# Patient Record
Sex: Female | Born: 1960 | Race: White | Hispanic: No | Marital: Married | Smoking: Never smoker
Health system: Southern US, Community
[De-identification: ages and names within clinical notes are randomized; demographics above are authoritative.]

## PROBLEM LIST (undated history)

## (undated) DIAGNOSIS — F419 Anxiety disorder, unspecified: Secondary | ICD-10-CM

## (undated) DIAGNOSIS — N289 Disorder of kidney and ureter, unspecified: Secondary | ICD-10-CM

## (undated) DIAGNOSIS — G20 Parkinson's disease: Secondary | ICD-10-CM

## (undated) DIAGNOSIS — G20A1 Parkinson's disease without dyskinesia, without mention of fluctuations: Secondary | ICD-10-CM

## (undated) DIAGNOSIS — F32A Depression, unspecified: Secondary | ICD-10-CM

## (undated) DIAGNOSIS — C4490 Unspecified malignant neoplasm of skin, unspecified: Secondary | ICD-10-CM

## (undated) HISTORY — DX: Anxiety disorder, unspecified: F41.9

## (undated) HISTORY — DX: Parkinson's disease: G20

## (undated) HISTORY — PX: MOHS SURGERY: SUR867

## (undated) HISTORY — DX: Unspecified malignant neoplasm of skin, unspecified: C44.90

## (undated) HISTORY — DX: Depression, unspecified: F32.A

## (undated) HISTORY — DX: Disorder of kidney and ureter, unspecified: N28.9

## (undated) HISTORY — DX: Parkinson's disease without dyskinesia, without mention of fluctuations: G20.A1

---

## 2007-12-21 DIAGNOSIS — Z85828 Personal history of other malignant neoplasm of skin: Secondary | ICD-10-CM | POA: Insufficient documentation

## 2013-12-06 LAB — HM COLONOSCOPY

## 2016-10-09 DIAGNOSIS — F32A Depression, unspecified: Secondary | ICD-10-CM | POA: Insufficient documentation

## 2016-12-06 LAB — HM PAP SMEAR: HM Pap smear: NORMAL

## 2017-10-09 DIAGNOSIS — F411 Generalized anxiety disorder: Secondary | ICD-10-CM | POA: Insufficient documentation

## 2019-10-05 ENCOUNTER — Ambulatory Visit (INDEPENDENT_AMBULATORY_CARE_PROVIDER_SITE_OTHER): Payer: BC Managed Care – PPO | Admitting: Medical-Surgical

## 2019-10-05 ENCOUNTER — Other Ambulatory Visit: Payer: Self-pay

## 2019-10-05 ENCOUNTER — Encounter: Payer: Self-pay | Admitting: Medical-Surgical

## 2019-10-05 VITALS — BP 130/83 | HR 83 | Temp 98.0°F | Ht 66.0 in | Wt 134.0 lb

## 2019-10-05 DIAGNOSIS — G4700 Insomnia, unspecified: Secondary | ICD-10-CM | POA: Insufficient documentation

## 2019-10-05 DIAGNOSIS — F329 Major depressive disorder, single episode, unspecified: Secondary | ICD-10-CM

## 2019-10-05 DIAGNOSIS — N951 Menopausal and female climacteric states: Secondary | ICD-10-CM | POA: Insufficient documentation

## 2019-10-05 DIAGNOSIS — F411 Generalized anxiety disorder: Secondary | ICD-10-CM | POA: Diagnosis not present

## 2019-10-05 DIAGNOSIS — Z7689 Persons encountering health services in other specified circumstances: Secondary | ICD-10-CM | POA: Diagnosis not present

## 2019-10-05 DIAGNOSIS — T7840XA Allergy, unspecified, initial encounter: Secondary | ICD-10-CM | POA: Insufficient documentation

## 2019-10-05 DIAGNOSIS — F32A Depression, unspecified: Secondary | ICD-10-CM

## 2019-10-05 NOTE — Progress Notes (Signed)
New Patient Office Visit  Subjective:  Patient ID: Elaine Curry, female    DOB: 1960/04/15  Age: 59 y.o. MRN: [id number]  CC:  Chief Complaint  Patient presents with  . Establish Care    new patient     HPI Elaine Curry presenting to establish care.  Currently under the care of of the following specialists:  Dermatology-history of skin cancer which was removed.  Reports she does for regular skin checks and has had several areas that need to be addressed/removed.  OB/GYN-she goes to [facility] for her women's health.  She is currently up-to-date on her recommended screenings.  Mammogram will be due in August.  Psychiatry-manages her depression and anxiety.  Started sertraline and Wellbutrin 1 to 2 weeks ago.  She has not seen much benefit from that she had.  Notes the last year has been very rough and she is significantly struggling with depression anxiety related to her help concerns.  Sees Dr. Theda Sers in [place].  She has done counseling in the past but none recently.  She notes that her neurologist as well as her psychiatrist has been encouraging her to start with counseling.  See PHQ/GAD-7 below.  Neurology-has been in the care from neurology for several years.  She reports she has been diagnosed with Parkinson's for different times but the diagnosis has been changed each time.  There has been quite a bit of frustration surrounding this as her symptoms are significantly affecting her quality of life.  She does have significant "sway" that affects her upper body focusing on her torso, head, and neck.  She notes this is worse depending on her dose of carbidopa-levodopa.  She has discussed this with them but unfortunately 1 tablet 4 times a day does not give her the needed duration and more than 1 tablet causes the "swaying" side effect.  She has an upcoming scan with neurology that will hopefully shed some light on what is going on.  Was most recently seeing a family medicine  practice with [facility] but would like to switch her care to [place] today.  She had recent lab work checked with notable hemoglobin of 10.6.  No history of anemia.  Anemia panel drawn at that time showed normal iron, folate, and B12.   Past Medical History:  Diagnosis Date  . Anxiety   . Depression   . Skin cancer     History reviewed. No pertinent surgical history.  History reviewed. No pertinent family history.  Social History   Socioeconomic History  . Marital status: Married    Spouse name: Not on file  . Number of children: Not on file  . Years of education: Not on file  . Highest education level: Not on file  Occupational History  . Not on file  Tobacco Use  . Smoking status: Not on file  Substance and Sexual Activity  . Alcohol use: Not on file  . Drug use: Not on file  . Sexual activity: Not on file  Other Topics Concern  . Not on file  Social History Narrative  . Not on file   Social Determinants of Health   Financial Resource Strain:   . Difficulty of Paying Living Expenses:   Food Insecurity:   . Worried About Charity fundraiser in the Last Year:   . Arboriculturist in the Last Year:   Transportation Needs:   . Film/video editor (Medical):   Marland Kitchen Lack of Transportation (Non-Medical):   Physical Activity:   .  Days of Exercise per Week:   . Minutes of Exercise per Session:   Stress:   . Feeling of Stress :   Social Connections:   . Frequency of Communication with Friends and Family:   . Frequency of Social Gatherings with Friends and Family:   . Attends Religious Services:   . Active Member of Clubs or Organizations:   . Attends Archivist Meetings:   Marland Kitchen Marital Status:   Intimate Partner Violence:   . Fear of Current or Ex-Partner:   . Emotionally Abused:   Marland Kitchen Physically Abused:   . Sexually Abused:     ROS Review of Systems  Constitutional: Positive for fatigue. Negative for chills, fever and unexpected weight change.  HENT:  Positive for voice change.   Eyes: Positive for visual disturbance.  Respiratory: Negative for cough, chest tightness, shortness of breath and wheezing.   Cardiovascular: Negative for chest pain, palpitations and leg swelling.  Gastrointestinal: Positive for constipation.  Musculoskeletal: Positive for back pain, myalgias and neck pain.  Neurological: Positive for dizziness, tremors, speech difficulty, weakness, light-headedness, numbness and headaches. Negative for seizures, syncope and facial asymmetry.  Hematological: Bruises/bleeds easily.  Psychiatric/Behavioral: Positive for dysphoric mood, sleep disturbance and suicidal ideas. Negative for self-injury. The patient is nervous/anxious.    Depression screen PHQ 2/9 10/05/2019  Decreased Interest 3  Down, Depressed, Hopeless 3  PHQ - 2 Score 6  Altered sleeping 3  Tired, decreased energy 3  Change in appetite 2  Feeling bad or failure about yourself  2  Suicidal thoughts 3  PHQ-9 Score 19  Difficult doing work/chores Somewhat difficult   GAD 7 : Generalized Anxiety Score 10/05/2019  Nervous, Anxious, on Edge 2  Control/stop worrying 2  Worry too much - different things 2  Trouble relaxing 2  Restless 2  Easily annoyed or irritable 2  Afraid - awful might happen 2  Total GAD 7 Score 14   Objective:   Today's Vitals: BP 130/83 (BP Location: Left Arm, Patient Position: Sitting)   Pulse 83   Temp 98 F (36.7 C)   Ht 5\' 6"  (1.676 m)   Wt 134 lb (60.8 kg)   SpO2 95%   BMI 21.63 kg/m   Physical Exam Vitals reviewed.  Constitutional:      General: She is not in acute distress.    Appearance: Normal appearance.  HENT:     Head: Normocephalic and atraumatic.  Cardiovascular:     Rate and Rhythm: Normal rate and regular rhythm.     Pulses: Normal pulses.     Heart sounds: Normal heart sounds. No murmur heard.  No friction rub. No gallop.   Pulmonary:     Effort: Pulmonary effort is normal. No respiratory distress.      Breath sounds: Normal breath sounds. No wheezing.  Skin:    General: Skin is warm and dry.  Neurological:     Mental Status: She is alert and oriented to person, place, and time.     Coordination: Coordination abnormal.     Comments: Involuntary choreiform movements affecting the trunk, head, and neck; continuous in nature for the duration of the appointment. Balance disturbed with ambulation.  Psychiatric:        Mood and Affect: Mood normal.        Behavior: Behavior normal.        Thought Content: Thought content normal.        Judgment: Judgment normal.     Assessment & Plan:  1. Encounter to establish care Reviewed available records and discussed health concerns with patient.  She is being seen by multiple specialist so we will manage her general primary care needs.  With her recent new onset anemia, discussed rechecking her CBC today.  With the significant level of "sway", she would like to postpone as it is very difficult to have blood drawn with such excessive movement.  Advised patient should she start to feel safe and worsening fatigue, lightheadedness, dizziness, etc. that she should return so that we can check her CBC again.  2. Depression, unspecified depression type/GAD Discussed expectations of improvement when on antidepressants.  Recommend close follow-up with psychiatry.  Discussed counseling and the benefits that he may bring.  She understands that finding the right connection is very important for successful counseling.  Advised patient that should she decide counseling is something she would like to try she can either touch base with psychiatry or me and we will get her in touch with someone for therapy.  Outpatient Encounter Medications as of 10/05/2019  Medication Sig  . tiZANidine (ZANAFLEX) 4 MG tablet Take by mouth.  Marland Kitchen buPROPion (WELLBUTRIN XL) 150 MG 24 hr tablet Take 150 mg by mouth daily.  . carbidopa-levodopa (SINEMET IR) 25-100 MG tablet Take 1.5 tablets by  mouth 4 (four) times daily.  Marland Kitchen estradiol-norethindrone (MIMVEY) 1-0.5 MG tablet Mimvey 1 mg-0.5 mg tablet  TAKE 1 TABLET EVERY DAY  . Eszopiclone 3 MG TABS Take 3 mg by mouth at bedtime.  . gabapentin (NEURONTIN) 300 MG capsule Take 600 mg by mouth 2 (two) times daily.  Marland Kitchen LORazepam (ATIVAN) 0.5 MG tablet Take 0.5 mg by mouth 2 (two) times daily as needed.  . sertraline (ZOLOFT) 50 MG tablet Take by mouth.   No facility-administered encounter medications on file as of 10/05/2019.    Follow-up: Return in about 3 months (around 01/05/2020) for follow up on anemia.   Clearnce Sorrel, DNP, APRN, FNP-BC [facility]

## 2019-11-02 ENCOUNTER — Ambulatory Visit: Payer: BC Managed Care – PPO | Admitting: Sports Medicine

## 2019-12-09 ENCOUNTER — Other Ambulatory Visit: Payer: Self-pay

## 2019-12-09 ENCOUNTER — Encounter: Payer: Self-pay | Admitting: Medical-Surgical

## 2019-12-09 ENCOUNTER — Ambulatory Visit (INDEPENDENT_AMBULATORY_CARE_PROVIDER_SITE_OTHER): Payer: BC Managed Care – PPO | Admitting: Medical-Surgical

## 2019-12-09 VITALS — BP 106/58 | HR 118 | Temp 98.4°F | Ht 66.0 in | Wt 129.0 lb

## 2019-12-09 DIAGNOSIS — R55 Syncope and collapse: Secondary | ICD-10-CM | POA: Diagnosis not present

## 2019-12-09 DIAGNOSIS — N289 Disorder of kidney and ureter, unspecified: Secondary | ICD-10-CM

## 2019-12-09 NOTE — Progress Notes (Signed)
Subjective:    CC: chronic syncope  HPI: Pleasant 59 year old female accompanied by her husband presenting today for "chronic syncope".  Has been having episodes over the last 3 weeks intermittently where she "passes out" for 15 to 20 seconds before returning to consciousness.  Her husband witnessed the first episode 3 weeks ago when she got out of the car in a hurry to get in the store.  She made it to the sidewalk but then "convulsed" followed by passing out with her eyes closed and falling towards the ground.  He was closed behind her and was able to catch her so there was no injury.  He yelled for an ambulance to be called.  At that time she awoke and yelled no ambulance.  She has had several of these episodes recently where she feels very hot and her vision starts to go dark.  She closes her eyes and she notes that her extremities begin to tremor.  Her balance has been off and she is now using a walker or a cane to ambulate.  She also notes her speech has changed and has been difficult to understand by others.  She has had at least 4 episodes like this in the past month.  Notes that changing position very quickly worsens the symptoms while laying down or sitting down tends to help.  She does have a bit of an elevation in heart rate with the symptoms but no other accompanying issues.  She fell yesterday and hit her head on nightstand but aside from bruising has had no other injury.  Heart disease does run in her family in both her dad, mother, and her 2 brothers.  She has had no personal history of heart disease.  Is being followed by neurology for Parkinson's disease and a plan is in the works to have a brain stimulator implanted.  She does take several medications and feels that her symptoms now are related to side effects.  I reviewed the past medical history, family history, social history, surgical history, and allergies today and no changes were needed.  Please see the problem list section below  in epic for further details.  Past Medical History: Past Medical History:  Diagnosis Date  . Anxiety   . Depression   . Skin cancer    Past Surgical History: History reviewed. No pertinent surgical history. Social History: Social History   Socioeconomic History  . Marital status: Married    Spouse name: Not on file  . Number of children: Not on file  . Years of education: Not on file  . Highest education level: Not on file  Occupational History  . Not on file  Tobacco Use  . Smoking status: Never Smoker  . Smokeless tobacco: Never Used  Substance and Sexual Activity  . Alcohol use: Yes    Comment: Occasionally  . Drug use: Never  . Sexual activity: Not Currently  Other Topics Concern  . Not on file  Social History Narrative  . Not on file   Social Determinants of Health   Financial Resource Strain:   . Difficulty of Paying Living Expenses: Not on file  Food Insecurity:   . Worried About Charity fundraiser in the Last Year: Not on file  . Ran Out of Food in the Last Year: Not on file  Transportation Needs:   . Lack of Transportation (Medical): Not on file  . Lack of Transportation (Non-Medical): Not on file  Physical Activity:   . Days  of Exercise per Week: Not on file  . Minutes of Exercise per Session: Not on file  Stress:   . Feeling of Stress : Not on file  Social Connections:   . Frequency of Communication with Friends and Family: Not on file  . Frequency of Social Gatherings with Friends and Family: Not on file  . Attends Religious Services: Not on file  . Active Member of Clubs or Organizations: Not on file  . Attends Archivist Meetings: Not on file  . Marital Status: Not on file   Family History: History reviewed. No pertinent family history. Allergies: No Known Allergies Medications: See med rec.  Review of Systems: See HPI for pertinent positives and negatives.   Objective:    General: Well Developed, well nourished, and in no  acute distress.  Neuro: Alert and oriented x3.  Chorea affecting the head, neck, and torso but sparing the upper arms and hands.  Speech overall clear. HEENT: Normocephalic, atraumatic.  Skin: Warm and dry. Cardiac: Regular rate and rhythm, no murmurs rubs or gallops, no lower extremity edema.  Respiratory: Clear to auscultation bilaterally. Not using accessory muscles, speaking in full sentences.  EKG-rate 77, normal rhythm, normal axis.  Chorea movements interfering with reading and producing significant artifact. Impression and Recommendations:    1. Syncope, unspecified syncope type Unclear etiology.  Evaluated by neurology recently who directed her to her PCP.  Her last hemoglobin was a bit low so we will recheck a CBC.  Also checking CMP for electrolytes, kidney, and liver function.  In office EKG completed with no acute changes but reading negatively affected by artifact from excessive constant movement.  We will go ahead and order an echocardiogram to be completed to rule out valvular malfunction.  Also ordering a Zio patch for long-term monitoring to rule out intermittent dysrhythmia.  Return in about 2 weeks (around 12/23/2019) for syncope follow up. ___________________________________________ Clearnce Sorrel, DNP, APRN, FNP-BC [facility]

## 2019-12-10 ENCOUNTER — Telehealth: Payer: Self-pay | Admitting: Radiology

## 2019-12-10 LAB — COMPLETE METABOLIC PANEL WITH GFR
AG Ratio: 1.8 (calc) (ref 1.0–2.5)
ALT: 3 U/L — ABNORMAL LOW (ref 6–29)
AST: 9 U/L — ABNORMAL LOW (ref 10–35)
Albumin: 4.4 g/dL (ref 3.6–5.1)
Alkaline phosphatase (APISO): 37 U/L (ref 37–153)
BUN/Creatinine Ratio: 23 (calc) — ABNORMAL HIGH (ref 6–22)
BUN: 42 mg/dL — ABNORMAL HIGH (ref 7–25)
CO2: 23 mmol/L (ref 20–32)
Calcium: 9.7 mg/dL (ref 8.6–10.4)
Chloride: 105 mmol/L (ref 98–110)
Creat: 1.85 mg/dL — ABNORMAL HIGH (ref 0.50–1.05)
GFR, Est African American: 34 mL/min/{1.73_m2} — ABNORMAL LOW (ref >=60)
GFR, Est Non African American: 29 mL/min/{1.73_m2} — ABNORMAL LOW (ref >=60)
Globulin: 2.5 g/dL (calc) (ref 1.9–3.7)
Glucose, Bld: 88 mg/dL (ref 65–99)
Potassium: 4.2 mmol/L (ref 3.5–5.3)
Sodium: 138 mmol/L (ref 135–146)
Total Bilirubin: 0.4 mg/dL (ref 0.2–1.2)
Total Protein: 6.9 g/dL (ref 6.1–8.1)

## 2019-12-10 LAB — CBC
HCT: 35.4 % (ref 35.0–45.0)
Hemoglobin: 11.6 g/dL — ABNORMAL LOW (ref 11.7–15.5)
MCH: 32.6 pg (ref 27.0–33.0)
MCHC: 32.8 g/dL (ref 32.0–36.0)
MCV: 99.4 fL (ref 80.0–100.0)
MPV: 14.3 fL — ABNORMAL HIGH (ref 7.5–12.5)
Platelets: 203 10*3/uL (ref 140–400)
RBC: 3.56 10*6/uL — ABNORMAL LOW (ref 3.80–5.10)
RDW: 13 % (ref 11.0–15.0)
WBC: 9.7 10*3/uL (ref 3.8–10.8)

## 2019-12-10 NOTE — Addendum Note (Signed)
Addended bySamuel Bouche on: 12/10/2019 11:09 AM   Modules accepted: Orders

## 2019-12-10 NOTE — Telephone Encounter (Signed)
Enrolled patient for a 14 day Zio XT monitor to be mailed to patients home. Brief instructions were gone over with the patient and she knows to expect the monitor to arrive in 3-4 days.  

## 2019-12-13 ENCOUNTER — Telehealth: Payer: Self-pay

## 2019-12-13 NOTE — Telephone Encounter (Signed)
If she would like, she can go ahead and come in today for the lab draw to check her kidney function. I can put the order in for a STAT CMP. If she is significantly concerned and the swelling is more than mild puffiness, we will need her to come in for a physical exam.

## 2019-12-13 NOTE — Telephone Encounter (Signed)
Pt called and said that she woke up this morning with swelling in her feet, arms, legs, and hands. She is nervous because of the lab results she received last week that resulted as:  Anemia has improved but still mildly low. Kidney function significantly decreased since last check in June. Possible dehydration? Recommend increasing fluid intake and hydrating very well over the next week. We will need to recheck the kidney function in one week.   Pt states that she has been drinking a lot of water for hydration and will be coming back in to have repeat labs done but is scared that something really bad is wrong right now. Please advise.

## 2019-12-14 ENCOUNTER — Telehealth: Payer: Self-pay

## 2019-12-14 NOTE — Telephone Encounter (Signed)
Pt called and LVM saying that she was seen at the ER this morning regarding her bilateral arm and leg pain and swelling. Pt said she thought she was having a heart attack. A telephone message was created on 12/13/2019 but got lost in message pools. I called her and she said that she was seen this morning at [facility] ER. She said that they did a lot of blood work and that everything came back normal. She said she was told it was due to dehydration and a panic attack. She is still having some dizziness, feels off balance, and the pain in her hands is returning. Looking in her chart under CareEverywhere there are not records of her being seen at the [facility] ER. She said that she was given a discharge summary, that her husband put it somewhere, that she is resting and cannot get up to get it, and her husband is asleep at the moment.

## 2019-12-15 ENCOUNTER — Ambulatory Visit (INDEPENDENT_AMBULATORY_CARE_PROVIDER_SITE_OTHER): Payer: BC Managed Care – PPO

## 2019-12-15 DIAGNOSIS — R55 Syncope and collapse: Secondary | ICD-10-CM | POA: Diagnosis not present

## 2019-12-15 NOTE — Telephone Encounter (Signed)
Care Everywhere has finally updated and her visit to the ER is showing now. Their impression of her symptoms was dehydration and panic attack. Her lab work looked better regarding her kidney function. Continue with plan to hydrate well and follow up as planned. Considering possible medication side effect but possibly related to cardiac concerns given troponins were elevated in the ED. Will plan to do further investigation to rule out cardiovascular concerns. We can discuss this further at her follow up appointment. If her symptoms continue and she would like to be seen sooner, she is welcome to come in before the 2 week follow up.

## 2019-12-15 NOTE — Telephone Encounter (Signed)
Patient aware of response and recommendations. She would like to wait and come in to have her bloodwork done on Friday and see what the results say and go from there. No further questions or concerns at this time.

## 2019-12-18 LAB — BASIC METABOLIC PANEL WITH GFR
BUN/Creatinine Ratio: 13 (calc) (ref 6–22)
BUN: 22 mg/dL (ref 7–25)
CO2: 24 mmol/L (ref 20–32)
Calcium: 9.9 mg/dL (ref 8.6–10.4)
Chloride: 103 mmol/L (ref 98–110)
Creat: 1.67 mg/dL — ABNORMAL HIGH (ref 0.50–1.05)
GFR, Est African American: 39 mL/min/{1.73_m2} — ABNORMAL LOW (ref >=60)
GFR, Est Non African American: 33 mL/min/{1.73_m2} — ABNORMAL LOW (ref >=60)
Glucose, Bld: 83 mg/dL (ref 65–139)
Potassium: 3.7 mmol/L (ref 3.5–5.3)
Sodium: 140 mmol/L (ref 135–146)

## 2019-12-19 NOTE — Addendum Note (Signed)
Addended bySamuel Bouche on: 12/19/2019 04:46 PM   Modules accepted: Orders

## 2020-01-03 ENCOUNTER — Ambulatory Visit (HOSPITAL_BASED_OUTPATIENT_CLINIC_OR_DEPARTMENT_OTHER)
Admission: RE | Admit: 2020-01-03 | Discharge: 2020-01-03 | Disposition: A | Discharge status: Home / Self Care | Payer: BC Managed Care – PPO | Source: Ambulatory Visit | Attending: Medical-Surgical | Admitting: Medical-Surgical

## 2020-01-03 ENCOUNTER — Other Ambulatory Visit: Payer: Self-pay

## 2020-01-03 ENCOUNTER — Other Ambulatory Visit (HOSPITAL_BASED_OUTPATIENT_CLINIC_OR_DEPARTMENT_OTHER): Payer: BC Managed Care – PPO

## 2020-01-03 DIAGNOSIS — R55 Syncope and collapse: Secondary | ICD-10-CM | POA: Diagnosis present

## 2020-01-03 LAB — ECHOCARDIOGRAM COMPLETE
Area-P 1/2: 3.74 cm2
S' Lateral: 1.85 cm

## 2020-01-04 ENCOUNTER — Ambulatory Visit: Payer: BC Managed Care – PPO | Admitting: Medical-Surgical

## 2020-01-10 ENCOUNTER — Other Ambulatory Visit: Payer: Self-pay | Admitting: Medical-Surgical

## 2020-01-10 DIAGNOSIS — I471 Supraventricular tachycardia: Secondary | ICD-10-CM

## 2020-01-10 DIAGNOSIS — R55 Syncope and collapse: Secondary | ICD-10-CM

## 2020-01-12 ENCOUNTER — Telehealth: Payer: Self-pay

## 2020-01-12 NOTE — Telephone Encounter (Signed)
Pt called and stated that she is wanting to get a handicap placard and wanted to know if Caryl Asp would be willing to complete the form. We have forms here and Caryl Asp was agreeable to completing the form. Pt aware that the form will be completed and she can come by later on this afternoon and pick it up from the front desk.

## 2020-01-14 ENCOUNTER — Telehealth: Payer: Self-pay

## 2020-01-14 LAB — HM MAMMOGRAPHY

## 2020-01-14 NOTE — Telephone Encounter (Signed)
Terri, the case manger for patient, called and LVM wanting to report that Elaine Curry has been having some swelling in her hands and feet. Asmi told her that this started not long after starting midodrine 5 mg TID.   I called Camillia and she confirmed the above. The Rx was started and written by her Neurologist, Dr. Buck Mam. I asked her if she called their office to tell them about this and she said that she had an appointment there on 01/07/2020 and they told her to wear compression stockings. I told her that since Dr. Buck Mam prescribed it and is managing it, that she needed to contact their office for further advisement. I also told her that I would send a message to [place] and let her know what is going on. She said that she had an appt scheduled with Joy on 01/25/2020 and she would show her then. I told her that she needed to contact Dr. Normajean Baxter office today and I would let Joy know about this because she did not need to wait and things get worse. Pt verbalized understanding and said she would call Dr. Buck Mam.

## 2020-01-19 LAB — HM MAMMOGRAPHY

## 2020-01-25 ENCOUNTER — Encounter: Payer: Self-pay | Admitting: Medical-Surgical

## 2020-01-25 ENCOUNTER — Ambulatory Visit (INDEPENDENT_AMBULATORY_CARE_PROVIDER_SITE_OTHER): Payer: BC Managed Care – PPO | Admitting: Medical-Surgical

## 2020-01-25 ENCOUNTER — Other Ambulatory Visit: Payer: Self-pay

## 2020-01-25 VITALS — BP 158/85 | HR 84 | Temp 97.6°F | Ht 66.0 in | Wt 132.0 lb

## 2020-01-25 DIAGNOSIS — R55 Syncope and collapse: Secondary | ICD-10-CM

## 2020-01-25 DIAGNOSIS — M7989 Other specified soft tissue disorders: Secondary | ICD-10-CM

## 2020-01-25 DIAGNOSIS — M5412 Radiculopathy, cervical region: Secondary | ICD-10-CM | POA: Insufficient documentation

## 2020-01-25 DIAGNOSIS — N1832 Chronic kidney disease, stage 3b: Secondary | ICD-10-CM | POA: Diagnosis not present

## 2020-01-25 DIAGNOSIS — D649 Anemia, unspecified: Secondary | ICD-10-CM

## 2020-01-25 DIAGNOSIS — F411 Generalized anxiety disorder: Secondary | ICD-10-CM

## 2020-01-25 DIAGNOSIS — I471 Supraventricular tachycardia: Secondary | ICD-10-CM | POA: Diagnosis not present

## 2020-01-25 DIAGNOSIS — F32A Depression, unspecified: Secondary | ICD-10-CM

## 2020-01-25 DIAGNOSIS — G4700 Insomnia, unspecified: Secondary | ICD-10-CM

## 2020-01-25 MED ORDER — PREDNISONE 50 MG PO TABS: 50.0000 mg | ORAL_TABLET | Freq: Every day | ORAL | 0 refills | Status: DC

## 2020-01-25 MED ORDER — METHOCARBAMOL 500 MG PO TABS: 500.0000 mg | ORAL_TABLET | Freq: Three times a day (TID) | ORAL | 0 refills | Status: DC | PRN

## 2020-01-25 NOTE — Progress Notes (Signed)
Subjective:    CC: anemia, chronic issue follow up  HPI: Elaine Curry 59 year old female presenting for follow up of anemia and chronic diseases.  Anemia- resolved. Last blood work showing hemoglobin of 11.8.  Syncopal episodes- resolved. Last episode of syncope 1 month ago. Has worked to stay well hydrated. Is drinking much more water flavored with Crystal Light.  Anxiety/depression- managed by psychiatry at this time but would like to have her medication managed here. She takes Sertraline 100mg  and wellbutrin XL 150mg  daily. Takes Lorazepam 0.5mg  1-2 times daily as needed for anxiety. Feels these medications are helpful but notes frustration surrounding her medical conditions and the frequent follow ups required. Denies SI/HI.  Insomnia- taking Eszopiclone 3mg  nightly for sleep. Tolerating well and feels this is helpful. Currently managed by psychiatry but would like this managed by [place] as well.   Left arm pain- history of bulging disc and 2 pinched cervical nerves. Now reports a sharp stabbing pain down her left arm starting at the shoulder that has been present for a while. She gets injections periodically in her neck and has the next ones coming up on Thursday. These are helpful if the doctor is able to get them in the right spot but sometimes they aren't.   Bilateral hand swelling/discoloration- notes bilateral hand swelling, R > L, happens at night when lying down. Also notes, hands become discolored white from the wrist up but she has also noted them bluish and then reddened. No prior diagnosis of Raynaud's.   I reviewed the past medical history, family history, social history, surgical history, and allergies today and no changes were needed.  Please see the problem list section below in epic for further details.  Past Medical History: Past Medical History:  Diagnosis Date  . Anxiety   . Depression   . Skin cancer    Past Surgical History: History reviewed. No pertinent surgical  history. Social History: Social History   Socioeconomic History  . Marital status: Married    Spouse name: Not on file  . Number of children: Not on file  . Years of education: Not on file  . Highest education level: Not on file  Occupational History  . Not on file  Tobacco Use  . Smoking status: Never Smoker  . Smokeless tobacco: Never Used  Substance and Sexual Activity  . Alcohol use: Yes    Comment: Occasionally  . Drug use: Never  . Sexual activity: Not Currently  Other Topics Concern  . Not on file  Social History Narrative  . Not on file   Social Determinants of Health   Financial Resource Strain:   . Difficulty of Paying Living Expenses: Not on file  Food Insecurity:   . Worried About Charity fundraiser in the Last Year: Not on file  . Ran Out of Food in the Last Year: Not on file  Transportation Needs:   . Lack of Transportation (Medical): Not on file  . Lack of Transportation (Non-Medical): Not on file  Physical Activity:   . Days of Exercise per Week: Not on file  . Minutes of Exercise per Session: Not on file  Stress:   . Feeling of Stress : Not on file  Social Connections:   . Frequency of Communication with Friends and Family: Not on file  . Frequency of Social Gatherings with Friends and Family: Not on file  . Attends Religious Services: Not on file  . Active Member of Clubs or Organizations: Not on file  .  Attends Archivist Meetings: Not on file  . Marital Status: Not on file   Family History: History reviewed. No pertinent family history. Allergies: No Known Allergies Medications: See med rec.  Review of Systems: See HPI for pertinent positives and negatives.   Objective:    General: Well Developed, well nourished, and in no acute distress.  Neuro: Alert and oriented x3. Chorea-like movements continue for the head, trunk, and upper extremities, somewhat improved from last visit. HEENT: Normocephalic, atraumatic.  Skin: Warm and  dry. Scattered bruising to bilateral forearms in various stages of healing. Cardiac: Regular rate and rhythm, no murmurs rubs or gallops, no lower extremity edema.  Respiratory: Clear to auscultation bilaterally. Not using accessory muscles, speaking in full sentences.  Impression and Recommendations:    1. Syncope, unspecified syncope type Stable. No episodes in the last month. Continue to hydrate well. Discussed driving laws in  regarding syncopal episodes and the need to go 6 months without syncope before legally returning to driving.   2. Stage 3b chronic kidney disease ([place]) Followed by nephrology. Recommend contacting them regarding the urine tests that have not been collected. They may have suggestions to facilitate getting a sample.   3. Paroxysmal SVT (supraventricular tachycardia) [facility]) Upcoming cardiology visit for further evaluation.   4. Cervical radiculopathy Plan to go forward with injections. Prednisone 50mg  x 5 days sent to pharmacy. Discuss oral prednisone with injecting MD to verify this is okay. If cleared, take as directed to reduce inflammation and help left arm pain. Since Tizanidine causes dizziness, sending a small supply of methocarbamol in to see if this works without side effects. If so, can change over. If not, can return to using Tizanidine.   5. Bilateral hand swelling Recommend bilateral compression gloves at night. Suspect discoloration of hands is Raynaud's from the description. Wearing gloves may help to prevent Raynaud's flares if that is what is happening.   6. Insomnia, unspecified type Continue Lunesta 3mg  nightly. Will be glad to manage this if she desires.   7. Generalized anxiety disorder/Depression, unspecified depression type Continue Wellbutrin 150mg  and Sertraline 100mg  daily. Continue Lorazepam 0.5mg  BID prn, encouraged sparing use due to risk of physical and psychological dependency. Will be glad to manage these prescriptions if she desires.    8. Anemia, unspecified type Resolved. With current kidney function concerns, will need to monitor closely.   Return in about 3 months (around 04/26/2020) for chronic disease follow up. ___________________________________________ Clearnce Sorrel, DNP, APRN, FNP-BC Primary [facility] and [place]

## 2020-01-25 NOTE — Telephone Encounter (Signed)
Pt had an OV with Samuel Bouche, FNP today.

## 2020-02-21 NOTE — Progress Notes (Signed)
Referring-Joy Charna Archer, NP Reason for referral-syncope and SVT  HPI: 59 year old female for evaluation of syncope and SVT at request of Samuel Bouche, NP.  Echocardiogram October 2021 showed ejection fraction 60 to 65%, mild mitral regurgitation.  Monitor October 2021 showed sinus rhythm with short bursts of SVT with the longest lasting 31.7 seconds, rare PACs and PVCs. Patient states that over the past 4 months she has had difficulties with dizziness with standing. She had lost approximately 20 pounds. She then had frank syncopal episodes. Each episode occurred when rising from the sitting to the standing position. She would suddenly become dizzy and pass out. She was unconscious for approximately 20 seconds. Patient denies preceding nausea, diaphoresis, palpitations, chest pain or dyspnea. Note she has had occasional complications in the past but had none with a monitor in place. Cardiology now asked to evaluate.  Current Outpatient Medications  Medication Sig Dispense Refill  . amantadine (SYMMETREL) 100 MG capsule Take 100 mg by mouth 3 (three) times daily.    . BELSOMRA 10 MG TABS Take 1 tablet by mouth at bedtime. 30 minutes before getting into bed    . buPROPion (WELLBUTRIN XL) 150 MG 24 hr tablet Take 150 mg by mouth daily.    . carbidopa-levodopa (SINEMET IR) 25-100 MG tablet Take 1 tablet by mouth 5 (five) times daily.     Marland Kitchen estradiol-norethindrone (MIMVEY) 1-0.5 MG tablet [place] 1 mg-0.5 mg tablet  TAKE 1 TABLET EVERY DAY    . Eszopiclone 3 MG TABS Take 3 mg by mouth at bedtime.    . gabapentin (NEURONTIN) 300 MG capsule Take 600 mg by mouth 2 (two) times daily.    Marland Kitchen LORazepam (ATIVAN) 1 MG tablet Take 1 mg by mouth 2 (two) times daily.    . melatonin 5 MG TABS Take 1 tablet by mouth at bedtime as needed.    . methocarbamol (ROBAXIN) 500 MG tablet Take 1 tablet (500 mg total) by mouth 3 (three) times daily as needed for muscle spasms. DO NOT TAKE WITH TIZANIDINE 30 tablet 0  .  sertraline (ZOLOFT) 50 MG tablet Take 100 mg by mouth daily.    Marland Kitchen tiZANidine (ZANAFLEX) 4 MG tablet Take 4 mg by mouth 4 (four) times daily as needed.     . predniSONE (DELTASONE) 50 MG tablet Take 1 tablet (50 mg total) by mouth daily. HOLD OFF UNTIL AFTER INJECTIONS, START IF OK WITH DR. Finis Bud (Patient not taking: Reported on 02/23/2020) 5 tablet 0   No current facility-administered medications for this visit.    No Known Allergies   Past Medical History:  Diagnosis Date  . Anxiety   . Depression   . Parkinson's disease ([place])   . Renal insufficiency   . Skin cancer     Past Surgical History:  Procedure Laterality Date  . MOHS SURGERY      Social History   Socioeconomic History  . Marital status: Married    Spouse name: Not on file  . Number of children: 2  . Years of education: Not on file  . Highest education level: Not on file  Occupational History  . Not on file  Tobacco Use  . Smoking status: Never Smoker  . Smokeless tobacco: Never Used  Substance and Sexual Activity  . Alcohol use: Yes    Comment: Occasionally  . Drug use: Never  . Sexual activity: Not Currently  Other Topics Concern  . Not on file  Social History Narrative  . Not on  file   Social Determinants of Health   Financial Resource Strain:   . Difficulty of Paying Living Expenses: Not on file  Food Insecurity:   . Worried About Charity fundraiser in the Last Year: Not on file  . Ran Out of Food in the Last Year: Not on file  Transportation Needs:   . Lack of Transportation (Medical): Not on file  . Lack of Transportation (Non-Medical): Not on file  Physical Activity:   . Days of Exercise per Week: Not on file  . Minutes of Exercise per Session: Not on file  Stress:   . Feeling of Stress : Not on file  Social Connections:   . Frequency of Communication with Friends and Family: Not on file  . Frequency of Social Gatherings with Friends and Family: Not on file  . Attends Religious  Services: Not on file  . Active Member of Clubs or Organizations: Not on file  . Attends Archivist Meetings: Not on file  . Marital Status: Not on file  Intimate Partner Violence:   . Fear of Current or Ex-Partner: Not on file  . Emotionally Abused: Not on file  . Physically Abused: Not on file  . Sexually Abused: Not on file    Family History  Problem Relation Age of Onset  . Lung cancer Mother   . Esophageal cancer Father     ROS: Left shoulder and neck pain but no fevers or chills, productive cough, hemoptysis, dysphasia, odynophagia, melena, hematochezia, dysuria, hematuria, rash, seizure activity, orthopnea, PND, claudication. Remaining systems are negative.  Physical Exam:   Blood pressure (!) 145/86, pulse 67, height 5\' 6"  (1.676 m), weight 132 lb (59.9 kg).  General:  Well developed/well nourished in NAD Skin warm/dry Patient not depressed No peripheral clubbing Back-normal HEENT-normal/normal eyelids Neck supple/normal carotid upstroke bilaterally; no bruits; no JVD; no thyromegaly chest - CTA/ normal expansion CV - RRR/normal S1 and S2; no murmurs, rubs or gallops;  PMI nondisplaced Abdomen -NT/ND, no HSM, no mass, + bowel sounds, no bruit 2+ femoral pulses, no bruits Ext-1+ ankle edema, no chords Neuro-resting tremor noted and exam consistent with Parkinson's.  ECG -December 09, 2019-sinus rhythm with probable precordial lead reversal.  Personally reviewed  Electrocardiogram today shows sinus rhythm with no ST changes.  Personally reviewed.  A/P  1 syncope-symptoms clearly secondary to orthostatic hypotension based on history. This may be related to her Parkinson's. However she had also lost 20 pounds prior to her recent events. We discussed the importance of hydration and increasing sodium intake. She has been doing this and her symptoms are much improved. We will consider addition of compression hose or medications in the future if needed. Note her  LV function is normal.  2 supraventricular tachycardia-patient noted to have 30 seconds of SVT on recent monitor. However she was asymptomatic and does not have significant problems with palpitations. I would like to avoid a beta-blocker at this point as it may exacerbate her orthostatic hypotension. We can consider low-dose in the future if needed. She will contact us if she has palpitations that are worsening.  Kirk Ruths, MD

## 2020-02-23 ENCOUNTER — Ambulatory Visit: Payer: BC Managed Care – PPO | Admitting: Cardiology

## 2020-02-23 ENCOUNTER — Other Ambulatory Visit: Payer: Self-pay

## 2020-02-23 ENCOUNTER — Encounter: Payer: Self-pay | Admitting: Cardiology

## 2020-02-23 VITALS — BP 145/86 | HR 67 | Ht 66.0 in | Wt 132.0 lb

## 2020-02-23 DIAGNOSIS — R55 Syncope and collapse: Secondary | ICD-10-CM | POA: Diagnosis not present

## 2020-02-23 DIAGNOSIS — I471 Supraventricular tachycardia: Secondary | ICD-10-CM

## 2020-02-23 NOTE — Patient Instructions (Signed)

## 2020-03-16 ENCOUNTER — Encounter: Payer: Self-pay | Admitting: Medical-Surgical

## 2020-03-16 ENCOUNTER — Ambulatory Visit (INDEPENDENT_AMBULATORY_CARE_PROVIDER_SITE_OTHER): Payer: BC Managed Care – PPO | Admitting: Medical-Surgical

## 2020-03-16 VITALS — BP 135/80 | HR 72 | Temp 97.5°F | Ht 66.0 in | Wt 129.0 lb

## 2020-03-16 DIAGNOSIS — R441 Visual hallucinations: Secondary | ICD-10-CM

## 2020-03-16 DIAGNOSIS — M545 Low back pain, unspecified: Secondary | ICD-10-CM | POA: Diagnosis not present

## 2020-03-16 NOTE — Progress Notes (Signed)
Subjective:    CC: low back pain, hallucination  HPI: Pleasant 59 year old female presenting today with reports of low back pain and hallucinations.  Back pain- started approximately 1 week ago. Notes the pain is in the left and radiates around the hip into the inguinal crease. Pain is sharp, stabbing at times but otherwise a constant ache. Worse when bending over, best when lying flat. Stretching while lying on the floor helps some too. Tizanidine helps some but not enough and it makes her drowsy. The methocarbamol did not work for her at all so is not taking that. Has the prednisone burst prescription at home that was previously prescribed for her neck but she never took since it got better on it's own. No history of back surgeries or injuries. No recent falls or trauma.   Hallucinations- has been having hallucinations for about a week or so. Notes only visual hallucinations, none have been auditory. Examples include seeing bed bugs, a lizard stuck in the sheet, and people at the door. Also saw a snake near her hand last night. The most concerning was seeing someone trying to steal her car or break in on the [employer] doorbell camera. She went and got the gun out of the gun safe and had it sitting on the table, called the police and her husband. No evidence of any of these things has been found. Patient reports these things look real to her. Her husband has tried to argue with her to make her understand that what she is seeing is not real. No vision loss, alcohol/illegal drug use. No signs/symptoms of infection. She is on Belsomra at bedtime, Ativan BID, Sertraline daily, and Wellbutrin daily. Discussed stopping or reducing the dose of these medications but she is concerned about being able to sleep and possible effects on mood. Husband and patient would like me to communicate with neurology to see what their take on it is.   I reviewed the past medical history, family history, social history, surgical  history, and allergies today and no changes were needed.  Please see the problem list section below in epic for further details.  Past Medical History: Past Medical History:  Diagnosis Date  . Anxiety   . Depression   . Parkinson's disease ([place])   . Renal insufficiency   . Skin cancer    Past Surgical History: Past Surgical History:  Procedure Laterality Date  . MOHS SURGERY     Social History: Social History   Socioeconomic History  . Marital status: Married    Spouse name: Not on file  . Number of children: 2  . Years of education: Not on file  . Highest education level: Not on file  Occupational History  . Not on file  Tobacco Use  . Smoking status: Never Smoker  . Smokeless tobacco: Never Used  Substance and Sexual Activity  . Alcohol use: Yes    Comment: Occasionally  . Drug use: Never  . Sexual activity: Not Currently  Other Topics Concern  . Not on file  Social History Narrative  . Not on file   Social Determinants of Health   Financial Resource Strain: Not on file  Food Insecurity: Not on file  Transportation Needs: Not on file  Physical Activity: Not on file  Stress: Not on file  Social Connections: Not on file   Family History: Family History  Problem Relation Age of Onset  . Lung cancer Mother   . Esophageal cancer Father    Allergies: No  Known Allergies Medications: See med rec.  Review of Systems: See HPI for pertinent positives and negatives.   Objective:    General: Well Developed, well nourished, and in no acute distress.  Neuro: Alert and oriented x3.  HEENT: Normocephalic, atraumatic.  Skin: Warm and dry. Cardiac: Regular rate and rhythm, no murmurs rubs or gallops, no lower extremity edema.  Respiratory: Clear to auscultation bilaterally. Not using accessory muscles, speaking in full sentences. MSK: Tenderness along the lumbar spine to the left paraspinal muscles. Difficult to assess due to neurological issues with tremors,  muscle spasms, and limitations in ROM. Negative for straight leg raise bilaterally. + FABER on left with mild + FABER on right. Negative FADIR.   Impression and Recommendations:    1. Acute left-sided low back pain without sciatica Advised patient to go ahead and take Prednisone 50mg  daily x 5 days that she has at home. Continue Tizanidine as prescribed. Avoiding more sedating muscle relaxers at this point given the hallucinations. Ok to participate with PT as this may help. Avoid strenuous activities that require bending/lifting until pain has improved.  2. Visual hallucinations On Belsomra for over 1 month, unsure if this is causing or exacerbating the hallucination. Patient does not want to stop this if possible. No signs or symptoms of infection at this time. Reaching out to Dr. to see if she has any recommendations or if she would like to see the patient in office sooner than her scheduled appointment in February. Contacted the physician's access line with [facility], information given to have her paged for call back. Advised patient's husband to alter access to firearms as this is a danger to others. Discussed how to handle hallucinations and to avoid arguing which only causes worsened stress and anxiety.   Return if symptoms worsen or fail to improve. ___________________________________________ [facility], DNP, APRN, FNP-BC Primary Care and Sports Medicine [facility] [place]

## 2020-03-22 ENCOUNTER — Other Ambulatory Visit: Payer: Self-pay

## 2020-03-22 ENCOUNTER — Ambulatory Visit (INDEPENDENT_AMBULATORY_CARE_PROVIDER_SITE_OTHER): Payer: BC Managed Care – PPO

## 2020-03-22 DIAGNOSIS — M545 Low back pain, unspecified: Secondary | ICD-10-CM

## 2020-03-22 DIAGNOSIS — M47816 Spondylosis without myelopathy or radiculopathy, lumbar region: Secondary | ICD-10-CM

## 2020-03-30 ENCOUNTER — Other Ambulatory Visit: Payer: Self-pay

## 2020-03-30 MED ORDER — LORAZEPAM 0.5 MG PO TABS: 0.5000 mg | ORAL_TABLET | Freq: Two times a day (BID) | ORAL | 2 refills | Status: DC | PRN

## 2020-03-30 NOTE — Telephone Encounter (Signed)
Pt called and said that she had talked to Joy about her handling her dep/anx meds and Joy agreed to take over management of them all. Pt is in need of a refill of lorazepam 1 mg. Rx has been tee'd up below and is ready for review and approval/denial. Pharmacy has been verified.

## 2020-03-30 NOTE — Telephone Encounter (Signed)
Patient aware via voicemail that the Rx has been sent to the pharmacy. Instructed patient to call back if there are any questions or concerns.

## 2020-04-03 ENCOUNTER — Telehealth: Payer: Self-pay

## 2020-04-03 NOTE — Telephone Encounter (Signed)
Pt states she is feeling dehydrated again. She has dry lips, a dry mouth, and is shaking. Has been having hallucinations for the last 2 weeks. States she has been drinking water, about 40 oz in a day. She has also had an increase in falls. Pt states the worse sx is the hallucinations. She thought it was due to her sleeping med so she quit taking that and the hallucinations have not gone away. She has not called Dr. Buck Mam about this. Please advise.

## 2020-04-03 NOTE — Telephone Encounter (Signed)
If she is feeling dehydrated, she will need to be evaluated in person for possible need for IV fluids. As for the hallucinations, recommend she contact neurology. I tried reaching out to them but was unable to contact them or forward our note from 1 system to another. 40 ounces per day of water is not enough to prevent dehydration. She needs to aim for at least 64 ounces of water daily.  I have a 10:30 AM appointment open tomorrow or a 2 PM appointment on Wednesday if she would like to come in for evaluation/possible IV fluids. If that time is not helpful and her symptoms are very bothersome, recommend she be seen at urgent care or the emergency room. In the meantime, recommend she increase her p.o. fluid intake to no less than 64 ounces of water daily.

## 2020-04-03 NOTE — Telephone Encounter (Signed)
Pt informed and verbalized understanding. She wanted to go ahead and schedule the appt offered for Wednesday at 2:00 PM, but said if she was feeling better tomorrow she will call and cancel the appt. She said she would call her Neuro tomorrow about this as well.

## 2020-04-05 ENCOUNTER — Encounter: Payer: Self-pay | Admitting: Medical-Surgical

## 2020-04-05 ENCOUNTER — Other Ambulatory Visit: Payer: Self-pay

## 2020-04-05 ENCOUNTER — Ambulatory Visit (INDEPENDENT_AMBULATORY_CARE_PROVIDER_SITE_OTHER): Payer: BC Managed Care – PPO | Admitting: Medical-Surgical

## 2020-04-05 ENCOUNTER — Telehealth: Payer: Self-pay

## 2020-04-05 VITALS — BP 116/74 | HR 91 | Temp 98.5°F | Ht 66.0 in | Wt 125.0 lb

## 2020-04-05 DIAGNOSIS — E860 Dehydration: Secondary | ICD-10-CM

## 2020-04-05 NOTE — Telephone Encounter (Signed)
Discussed during office visit today.

## 2020-04-05 NOTE — Telephone Encounter (Signed)
Pt's case manager called and LVM wanting to make sure that we are aware that she is having some hallucinations. She said that she did not know if it was due to the bupropion or any other med she is taking. She just wanted to make sure that we were aware this is going on.

## 2020-04-05 NOTE — Telephone Encounter (Signed)
Aware of hallucinations. Long-term Bupropion not likely to be the cause. She has talked to neuro and they made some medication adjustments yesterday. She is here in our office today for evaluation and will chat with her then.

## 2020-04-05 NOTE — Progress Notes (Signed)
Subjective:    CC: Dehydration  HPI: Pleasant 60 year old female presenting today accompanied by her brother.  Notes that she has only been drinking approximately 2 bottles of water each day and is feeling significantly dehydrated.  She does have a history of having some pretty significant dehydration.  Notes that she has been having a lot more falls lately and she has started feeling dizzy as she had previously.  Notes that she is continuing to have hallucinations regularly.  Hallucinations are visual, not auditory.  She has been seeing people that are not there occasionally but most of the time sees bugs and snakes.  Today notes that she has worms in her arms.  Admits that she tells her self these are not real but she was found at home with scissors cutting underwear because of the worm that was in there.  She is also stabbed the bed because there was a snake/bug in there.  Her husband has removed access to the guns in the home but locking up knives and scissors has not been done.  She did contact her neurologist who has made some medication changes.  We will be starting the new prescription of Seroquel tonight as well as the lower dose of amantadine.  Reports that her mouth is extremely dry and she feels significantly dehydrated.  No current complaint of dizziness, chest pain, palpitations, or nausea.  I reviewed the past medical history, family history, social history, surgical history, and allergies today and no changes were needed.  Please see the problem list section below in epic for further details.  Past Medical History: Past Medical History:  Diagnosis Date  . Anxiety   . Depression   . Parkinson's disease ([place])   . Renal insufficiency   . Skin cancer    Past Surgical History: Past Surgical History:  Procedure Laterality Date  . MOHS SURGERY     Social History: Social History   Socioeconomic History  . Marital status: Married    Spouse name: Not on file  . Number of  children: 2  . Years of education: Not on file  . Highest education level: Not on file  Occupational History  . Not on file  Tobacco Use  . Smoking status: Never Smoker  . Smokeless tobacco: Never Used  Substance and Sexual Activity  . Alcohol use: Yes    Comment: Occasionally  . Drug use: Never  . Sexual activity: Not Currently  Other Topics Concern  . Not on file  Social History Narrative  . Not on file   Social Determinants of Health   Financial Resource Strain: Not on file  Food Insecurity: Not on file  Transportation Needs: Not on file  Physical Activity: Not on file  Stress: Not on file  Social Connections: Not on file   Family History: Family History  Problem Relation Age of Onset  . Lung cancer Mother   . Esophageal cancer Father    Allergies: No Known Allergies Medications: See med rec.  Review of Systems: See HPI for pertinent positives and negatives.   Objective:    General: Well Developed, well nourished, and in no acute distress.  Neuro: Alert and oriented x3.  HEENT: Normocephalic, atraumatic.  Oral mucous membranes pink, dry with cracked lips. Skin: Warm and dry.  Scattered bruising in various stages of healing to the bilateral forearms and hands. Cardiac: Regular rate and rhythm, no murmurs rubs or gallops, no lower extremity edema.  Respiratory: Clear to auscultation bilaterally. Not using accessory  muscles, speaking in full sentences.  Impression and Recommendations:    1. Dehydration Discussed recommended daily fluid intake and encouraged compliance with drinking water throughout the day.  With her recent history and current symptoms, discussed IV fluid administration in office.  Patient and her brother agree that this is an optimal plan.  A 20-gauge peripheral IV was attempted x2 with the second being successfully placed in the right forearm and flushed with 10 cc of sterile saline.  1 L of normal saline was infused using a pressure bag and  gravity without difficulty.  There were no signs of infiltration at the IV site.  Infusion tolerated well.  Peripheral IV was discontinued and pressure held at the site with a sterile bandage.  Hemostasis was achieved and the site secured with gauze and Coban.  Discussed in detail the fluid and nutrition requirements she needs to maintain her current weight and state of health and prevent further illnesses or injuries.  Recommend securing knives, scissors, and other potential sharp objects at home to prevent self-harm related to hallucinations.  Return if symptoms worsen or fail to improve. ___________________________________________ Clearnce Sorrel, DNP, APRN, FNP-BC [facility] [place]

## 2020-04-17 ENCOUNTER — Other Ambulatory Visit: Payer: Self-pay

## 2020-04-17 MED ORDER — GABAPENTIN 300 MG PO CAPS: 600.0000 mg | ORAL_CAPSULE | Freq: Two times a day (BID) | ORAL | 0 refills | Status: DC

## 2020-04-17 MED ORDER — SERTRALINE HCL 50 MG PO TABS: 100.0000 mg | ORAL_TABLET | Freq: Every day | ORAL | 0 refills | Status: DC

## 2020-04-17 MED ORDER — BUPROPION HCL ER (XL) 150 MG PO TB24
150.0000 mg | ORAL_TABLET | Freq: Every day | ORAL | 0 refills | Status: DC
Start: 2020-04-17 — End: 2020-07-27

## 2020-04-17 NOTE — Telephone Encounter (Signed)
Pt said that Joy told her she would take over prescribing and management of bupropion 150 mg, gabapentin 300 mg, and sertraline 50 mg. She is now in need of refills of these meds. Rx's have been tee'd up below and are ready for review and approval/denial

## 2020-04-17 NOTE — Telephone Encounter (Signed)
Pt states she is experiencing shakiness and a dry mouth and is wondering if it could be due to dehydration. She has called her neurologist office but has not heard back from them. Pt states she is drinking three 20 oz bottles of water daily, for a total of 60 oz/day. Per verbal instruction from Samuel Bouche, [place], due to her schedule being completely booked today, if her sx are severe, then she should proceed to the ED or UC. Pt aware and verbalized understanding, but states she will "wait and see"

## 2020-04-25 ENCOUNTER — Telehealth: Payer: Self-pay | Admitting: Medical-Surgical

## 2020-04-25 NOTE — Telephone Encounter (Signed)
FMLA papers completed and placed in Kristen's box for faxing.

## 2020-04-25 NOTE — Telephone Encounter (Signed)
Ok. Thanks!

## 2020-04-25 NOTE — Telephone Encounter (Signed)
Tried faxing forms to number provided and all 6 attempts failed. Called pt and told her this and that the originals would be at the front desk for him to stop by and pick up. Originals given to [place] who said she would try to fax the forms again from the fax machine up front.

## 2020-04-25 NOTE — Telephone Encounter (Signed)
Patient's husband dropped off FMLA forms to be filled out, stated patient was just in a few weeks ago and patient has another appt coming up on 05/03/20. AM (04/25/20) paperwork left in provider box

## 2020-04-25 NOTE — Telephone Encounter (Signed)
I faxed it and got a successful fax back, will scan paperwork with successful fax into chart before placing it in the [place] folder up front for patient to pick up. AM

## 2020-04-27 ENCOUNTER — Other Ambulatory Visit: Payer: Self-pay | Admitting: Medical-Surgical

## 2020-05-03 ENCOUNTER — Encounter: Payer: Self-pay | Admitting: Medical-Surgical

## 2020-05-03 ENCOUNTER — Other Ambulatory Visit: Payer: Self-pay

## 2020-05-03 ENCOUNTER — Ambulatory Visit (INDEPENDENT_AMBULATORY_CARE_PROVIDER_SITE_OTHER): Payer: BC Managed Care – PPO | Admitting: Medical-Surgical

## 2020-05-03 ENCOUNTER — Ambulatory Visit: Payer: BC Managed Care – PPO | Admitting: Medical-Surgical

## 2020-05-03 VITALS — BP 123/73 | HR 78 | Wt 122.1 lb

## 2020-05-03 DIAGNOSIS — F411 Generalized anxiety disorder: Secondary | ICD-10-CM | POA: Diagnosis not present

## 2020-05-03 DIAGNOSIS — R634 Abnormal weight loss: Secondary | ICD-10-CM | POA: Insufficient documentation

## 2020-05-03 DIAGNOSIS — D649 Anemia, unspecified: Secondary | ICD-10-CM | POA: Insufficient documentation

## 2020-05-03 DIAGNOSIS — N1832 Chronic kidney disease, stage 3b: Secondary | ICD-10-CM | POA: Diagnosis not present

## 2020-05-03 DIAGNOSIS — E860 Dehydration: Secondary | ICD-10-CM | POA: Insufficient documentation

## 2020-05-03 DIAGNOSIS — G4700 Insomnia, unspecified: Secondary | ICD-10-CM

## 2020-05-03 DIAGNOSIS — R441 Visual hallucinations: Secondary | ICD-10-CM | POA: Insufficient documentation

## 2020-05-03 DIAGNOSIS — Z0000 Encounter for general adult medical examination without abnormal findings: Secondary | ICD-10-CM

## 2020-05-03 DIAGNOSIS — M545 Low back pain, unspecified: Secondary | ICD-10-CM

## 2020-05-03 DIAGNOSIS — F32A Depression, unspecified: Secondary | ICD-10-CM

## 2020-05-03 DIAGNOSIS — H6121 Impacted cerumen, right ear: Secondary | ICD-10-CM | POA: Insufficient documentation

## 2020-05-03 DIAGNOSIS — I471 Supraventricular tachycardia: Secondary | ICD-10-CM | POA: Diagnosis not present

## 2020-05-03 DIAGNOSIS — M4726 Other spondylosis with radiculopathy, lumbar region: Secondary | ICD-10-CM | POA: Insufficient documentation

## 2020-05-03 DIAGNOSIS — H0012 Chalazion right lower eyelid: Secondary | ICD-10-CM | POA: Insufficient documentation

## 2020-05-03 DIAGNOSIS — R32 Unspecified urinary incontinence: Secondary | ICD-10-CM | POA: Insufficient documentation

## 2020-05-03 NOTE — Progress Notes (Signed)
Subjective:    CC: chronic disease follow up  HPI: Pleasant 60 year old presents for the following:   Hallucinations have stopped since neurology has started her on Seroquel at night. She is still taking Amanatadine and Sinemet as prescribed. Her husband reports they will see neurology next week and plan to discuss having the deep brain stimulator surgery. If they decide she does not qualify for it, may look to get a second opinion.   Dehydration- happily reports she is drinking at least 3 bottles of water per day along with other liquids. Notes skin looks better and she has had no syncope/near-syncope.   Mood- doing great for the last 4-5 days. Feels that the Seroquel has helped with her anxiety and depression both. Taking Zoloft and Wellbutrin as prescribed. Not using Ativan as frequently, none taken this week.   Back pain- managed with Tizanidine and prn excedrin/tylenol/Bayer back.   Nocturia- continues to void very little during the day but wakes frequently to void at night. Has started having urinary incontinence at night and is worried about this. Wears incontinence briefs at night in case of accidents now.   Insomnia- still waking frequently but Seroquel seems to help with sleeping and sleep quality.   Weight- continues to lose weight although she reports she is eating like a pig. Eats proteins but is very partial to sweets and desserts.   Ear s/s- has quite a bit of cerumen but feels like the right ear is clogged. Her husband bought an OTC ear irrigation kit and they were able to use it at home. They got a couple of chunks of hard cerumen out of the right ear but were unable to get it any clearer. Since then, her hearing has returned a bit but is still muffled. Has been using wax softening drops since then.   Eye issue- had a large "cyst" come up on the lower right eyelid about 2-3 weeks ago. Thought it was a stye but it got so large. Has been using baby shampoo to wash her eyes and  applying warm compresses to get it to go away. Notes it has improved drastically since then but still hasn't fully resolved. Thinks that it goes away faster if she has antibiotics.   I reviewed the past medical history, family history, social history, surgical history, and allergies today and no changes were needed.  Please see the problem list section below in epic for further details.  Past Medical History: Past Medical History:  Diagnosis Date  . Anxiety   . Depression   . Parkinson's disease ([place])   . Renal insufficiency   . Skin cancer    Past Surgical History: Past Surgical History:  Procedure Laterality Date  . MOHS SURGERY     Social History: Social History   Socioeconomic History  . Marital status: Married    Spouse name: Not on file  . Number of children: 2  . Years of education: Not on file  . Highest education level: Not on file  Occupational History  . Not on file  Tobacco Use  . Smoking status: Never Smoker  . Smokeless tobacco: Never Used  Substance and Sexual Activity  . Alcohol use: Yes    Comment: Occasionally  . Drug use: Never  . Sexual activity: Not Currently  Other Topics Concern  . Not on file  Social History Narrative  . Not on file   Social Determinants of Health   Financial Resource Strain: Not on file  Food Insecurity: Not  on file  Transportation Needs: Not on file  Physical Activity: Not on file  Stress: Not on file  Social Connections: Not on file   Family History: Family History  Problem Relation Age of Onset  . Lung cancer Mother   . Esophageal cancer Father    Allergies: No Known Allergies Medications: See med rec.  Review of Systems: See HPI for pertinent positives and negatives.   Objective:    General: Well Developed, well nourished, and in no acute distress.  Neuro: Alert and oriented x3.  HEENT: Normocephalic, atraumatic. Small chalazion to the lateral right lower eyelid, no drainage, mild erythema. Skin: Warm  and dry. Cardiac: Regular rate and rhythm, no murmurs rubs or gallops, no lower extremity edema.  Respiratory: Clear to auscultation bilaterally. Not using accessory muscles, speaking in full sentences.  Impression and Recommendations:    1. Stage 3b chronic kidney disease (HCC) Checking CMP.   2. Paroxysmal SVT (supraventricular tachycardia) (HCC) Stable without medications.   3. Insomnia, unspecified type Stable on Seroquel. Management of nocturia will likely resolve the issue.   4. Generalized anxiety disorder Continue Zoloft, Wellbutrin, and Seroquel. Use Ativan only for severe anxiety.   5. Depression, unspecified depression type See #4.  6. Anemia, unspecified type Checking iron panel today.  - Fe+TIBC+Fer  7. Weight loss Checking TSH. Work on increasing dietary protein and healthy fats.  - TSH  8. Preventative health care Checking lipid panel, CBC, and CMP.   - CBC - COMPLETE METABOLIC PANEL WITH GFR - Lipid panel  9. Urinary incontinence, unspecified type Attempt to obtain a urine sample unsuccessful. Concern for possible UTI causing new incontinence vs. Sleeping too deeply with Seroquel that she is unable to wake in time.  - POCT URINALYSIS DIP (CLINITEK)  10. Dehydration Stable with current fluid intake.   11. Visual hallucinations Resolved with Seroquel.   12. Acute left-sided low back pain without sciatica Continue Tizanidine and OTC pain relievers as needed.   13. Impacted cerumen of right ear Unable to irrigate in office today due to missing equipment. Recommend using cotton balls soaked in mineral oil in the ear canal for 10-20 minutes to allow for softening of wax. After, can use OTC irrigation kit or allow water to wash into the ears during her shower. If this is still not helpful, can return once our equipment is replaced and we will gladly complete the irrigation.   14. Chalazion of right lower eyelid Since it is improving on it's own, holding  off on antibiotics for now. Advise to continue warm compresses four times daily for one more week to see if it continues to improved. If no further improvement, may benefit from a short course of oral antibiotics.   Return in about 3 months (around 07/31/2020) for chronic disease follow up. ___________________________________________ Clearnce Sorrel, DNP, APRN, FNP-BC [facility] [place]

## 2020-05-04 LAB — IRON,TIBC AND FERRITIN PANEL
%SAT: 22 % (calc) (ref 16–45)
Ferritin: 64 ng/mL (ref 16–232)
Iron: 71 ug/dL (ref 45–160)
TIBC: 317 mcg/dL (calc) (ref 250–450)

## 2020-05-04 LAB — COMPLETE METABOLIC PANEL WITH GFR
AG Ratio: 1.5 (calc) (ref 1.0–2.5)
ALT: 3 U/L — ABNORMAL LOW (ref 6–29)
AST: 24 U/L (ref 10–35)
Albumin: 4.3 g/dL (ref 3.6–5.1)
Alkaline phosphatase (APISO): 53 U/L (ref 37–153)
BUN/Creatinine Ratio: 16 (calc) (ref 6–22)
BUN: 21 mg/dL (ref 7–25)
CO2: 26 mmol/L (ref 20–32)
Calcium: 9.5 mg/dL (ref 8.6–10.4)
Chloride: 106 mmol/L (ref 98–110)
Creat: 1.31 mg/dL — ABNORMAL HIGH (ref 0.50–1.05)
GFR, Est African American: 52 mL/min/{1.73_m2} — ABNORMAL LOW (ref >=60)
GFR, Est Non African American: 44 mL/min/{1.73_m2} — ABNORMAL LOW (ref >=60)
Globulin: 2.8 g/dL (calc) (ref 1.9–3.7)
Glucose, Bld: 95 mg/dL (ref 65–99)
Potassium: 4.2 mmol/L (ref 3.5–5.3)
Sodium: 141 mmol/L (ref 135–146)
Total Bilirubin: 0.4 mg/dL (ref 0.2–1.2)
Total Protein: 7.1 g/dL (ref 6.1–8.1)

## 2020-05-04 LAB — LIPID PANEL
Cholesterol: 258 mg/dL — ABNORMAL HIGH (ref <200)
HDL: 81 mg/dL (ref >=50)
LDL Cholesterol (Calc): 154 mg/dL (calc) — ABNORMAL HIGH
Non-HDL Cholesterol (Calc): 177 mg/dL (calc) — ABNORMAL HIGH (ref <130)
Total CHOL/HDL Ratio: 3.2 (calc) (ref <5.0)
Triglycerides: 115 mg/dL (ref <150)

## 2020-05-04 LAB — CBC
HCT: 37.5 % (ref 35.0–45.0)
Hemoglobin: 12.7 g/dL (ref 11.7–15.5)
MCH: 33.4 pg — ABNORMAL HIGH (ref 27.0–33.0)
MCHC: 33.9 g/dL (ref 32.0–36.0)
MCV: 98.7 fL (ref 80.0–100.0)
MPV: 12.1 fL (ref 7.5–12.5)
Platelets: 228 10*3/uL (ref 140–400)
RBC: 3.8 10*6/uL (ref 3.80–5.10)
RDW: 13.8 % (ref 11.0–15.0)
WBC: 6.9 10*3/uL (ref 3.8–10.8)

## 2020-05-04 LAB — TSH: TSH: 1.01 mIU/L (ref 0.40–4.50)

## 2020-05-05 ENCOUNTER — Telehealth: Payer: Self-pay

## 2020-05-05 MED ORDER — DOXYCYCLINE HYCLATE 100 MG PO TABS
100.0000 mg | ORAL_TABLET | Freq: Two times a day (BID) | ORAL | 0 refills | Status: DC
Start: 2020-05-05 — End: 2020-05-12

## 2020-05-05 NOTE — Telephone Encounter (Signed)
Doxycycline BID for 7 days sent to the pharmacy. Continue warm compresses 4 times daily.

## 2020-05-05 NOTE — Telephone Encounter (Signed)
Pt said that the eye problem she was having at her office visit on 05/03/2020, chalazion of right lower eye lid, has now spread and is on her left eye as well. Pt said she has been doing the warm compresses. She is wanting to know if she can get an antibiotic sent in.

## 2020-05-05 NOTE — Telephone Encounter (Signed)
Pt aware Rx has been sent to the pharmacy. No further questions or concerns at this time.   

## 2020-05-08 ENCOUNTER — Telehealth: Payer: Self-pay

## 2020-05-08 NOTE — Telephone Encounter (Signed)
Pt is scheduled for OV with Joy on 05/09/2020 for ear irrigation.

## 2020-05-08 NOTE — Telephone Encounter (Signed)
-----   Message from Ranelle Oyster, [place] sent at 05/04/2020  1:00 PM EST ----- Regarding: Ear irrigation equipment Are the parts in? If so...call her and have her schedule an appt to come in for bilateral ear irrigation

## 2020-05-08 NOTE — Telephone Encounter (Signed)
[place] (1st attempt)    Pt needed to have ear irrigation during her 05/03/2020 OV but we did not have all of the parts for a complete set of irrigation equipment. We now have all of the parts so we need to get her scheduled for an OV with Joy for irrigation.

## 2020-05-08 NOTE — Telephone Encounter (Signed)
Pt states she wakes up with some right ear pain, but as the morning progresses it will improve, but everything is still muffled. Informed her we have everything we need to irrigate her ears. Pt is agreeable to scheduling an OV with Joy to have the procedure done. Call transferred to the front desk for scheduling.

## 2020-05-09 ENCOUNTER — Other Ambulatory Visit: Payer: Self-pay

## 2020-05-09 ENCOUNTER — Encounter: Payer: Self-pay | Admitting: Medical-Surgical

## 2020-05-09 ENCOUNTER — Ambulatory Visit (INDEPENDENT_AMBULATORY_CARE_PROVIDER_SITE_OTHER): Payer: BC Managed Care – PPO | Admitting: Medical-Surgical

## 2020-05-09 DIAGNOSIS — H6123 Impacted cerumen, bilateral: Secondary | ICD-10-CM

## 2020-05-09 NOTE — Progress Notes (Signed)
Subjective:    CC: Ear pain  HPI: Pleasant 60 year old female presenting today for reevaluation of bilateral cerumen impaction with right ear pain.  She was seen in the office last week for follow-up on chronic diseases.  At that time, she was found to have a large amount of wax in both the ears, the right ear canal completely blocked.  Planned to complete irrigation at that time but unfortunately, however equipment was missing a piece.  We have since been able to obtain the piece that was missing and brought her back in today for evaluation.  Reports that she continues to have right ear pain that is described as aching and usually first thing in the morning.  The discomfort resolves after waking and becoming active for the day.  She was originally instructed to obtain mineral oil and cotton balls but reports they did not get the mineral oil from the store.  Denies fever, chills, tinnitus, sore throat, sinus congestion, and hearing loss.  I reviewed the past medical history, family history, social history, surgical history, and allergies today and no changes were needed.  Please see the problem list section below in epic for further details.  Past Medical History: Past Medical History:  Diagnosis Date  . Anxiety   . Depression   . Parkinson's disease ([place])   . Renal insufficiency   . Skin cancer    Past Surgical History: Past Surgical History:  Procedure Laterality Date  . MOHS SURGERY     Social History: Social History   Socioeconomic History  . Marital status: Married    Spouse name: Not on file  . Number of children: 2  . Years of education: Not on file  . Highest education level: Not on file  Occupational History  . Not on file  Tobacco Use  . Smoking status: Never Smoker  . Smokeless tobacco: Never Used  Substance and Sexual Activity  . Alcohol use: Yes    Comment: Occasionally  . Drug use: Never  . Sexual activity: Not Currently  Other Topics Concern  . Not on file   Social History Narrative  . Not on file   Social Determinants of Health   Financial Resource Strain: Not on file  Food Insecurity: Not on file  Transportation Needs: Not on file  Physical Activity: Not on file  Stress: Not on file  Social Connections: Not on file   Family History: Family History  Problem Relation Age of Onset  . Lung cancer Mother   . Esophageal cancer Father    Allergies: No Known Allergies Medications: See med rec.  Review of Systems: See HPI for pertinent positives and negatives.   Objective:    General: Well Developed, well nourished, and in no acute distress.  Neuro: Alert and oriented x3.  HEENT: Normocephalic, atraumatic. Right ear canal completely blocked with dark brown cerumen. Left ear canal with large amount of cerumen but small portion of TM visible. After irrigation, ear canals cleared and TMs visualized. Left TM normal. Right TM slightly cloudy without bulging or erythema.  Skin: Warm and dry. Cardiac: Regular rate and rhythm.  Respiratory: Not using accessory muscles, speaking in full sentences.   Impression and Recommendations:    1. Bilateral impacted cerumen Bilateral ear irrigation completed. Large amount of dark brown cerumen removed from the right ear. Full hearing restored. Currently on Doxycycline so that should cover for any potential infection. Finish antibiotics as prescribed. Recommend wax softening drops or weekly treatment with mineral oil soaked  cotton balls.   Return if symptoms worsen or fail to improve. ___________________________________________ Clearnce Sorrel, DNP, APRN, FNP-BC [facility] [place]

## 2020-06-01 ENCOUNTER — Encounter: Payer: Self-pay | Admitting: Medical-Surgical

## 2020-07-14 ENCOUNTER — Other Ambulatory Visit: Payer: Self-pay | Admitting: Medical-Surgical

## 2020-07-26 ENCOUNTER — Other Ambulatory Visit: Payer: Self-pay | Admitting: Medical-Surgical

## 2020-07-31 ENCOUNTER — Ambulatory Visit: Payer: BC Managed Care – PPO | Admitting: Medical-Surgical

## 2020-07-31 DIAGNOSIS — F411 Generalized anxiety disorder: Secondary | ICD-10-CM

## 2020-07-31 DIAGNOSIS — D649 Anemia, unspecified: Secondary | ICD-10-CM

## 2020-07-31 DIAGNOSIS — N1832 Chronic kidney disease, stage 3b: Secondary | ICD-10-CM

## 2020-07-31 DIAGNOSIS — G4700 Insomnia, unspecified: Secondary | ICD-10-CM

## 2020-07-31 DIAGNOSIS — F32A Depression, unspecified: Secondary | ICD-10-CM

## 2020-07-31 DIAGNOSIS — I471 Supraventricular tachycardia: Secondary | ICD-10-CM

## 2020-08-02 ENCOUNTER — Telehealth: Payer: Self-pay

## 2020-08-02 NOTE — Telephone Encounter (Signed)
Elaine Curry called complaining of frequent urination and strong urine odor. She was requesting a medication for a UTI. I advised she will need to be seen. She states she doesn't have a ride during the day. She states she has appointment on Friday with Joy and will wait until then. I advised she shouldn't wait and could go to the Urgent Care this evening. She refused and stated again she will wait until Friday.

## 2020-08-02 NOTE — Telephone Encounter (Signed)
See if willing ot do a virtual tomorrow.

## 2020-08-03 NOTE — Telephone Encounter (Signed)
Patient states she wants to wait until the appointment tomorrow.

## 2020-08-04 ENCOUNTER — Other Ambulatory Visit: Payer: Self-pay

## 2020-08-04 ENCOUNTER — Ambulatory Visit: Payer: BC Managed Care – PPO | Admitting: Medical-Surgical

## 2020-08-04 ENCOUNTER — Ambulatory Visit (INDEPENDENT_AMBULATORY_CARE_PROVIDER_SITE_OTHER): Payer: BC Managed Care – PPO | Admitting: Medical-Surgical

## 2020-08-04 ENCOUNTER — Encounter: Payer: Self-pay | Admitting: Medical-Surgical

## 2020-08-04 VITALS — BP 115/85 | HR 85 | Temp 97.4°F | Ht 66.0 in | Wt 129.0 lb

## 2020-08-04 DIAGNOSIS — F411 Generalized anxiety disorder: Secondary | ICD-10-CM

## 2020-08-04 DIAGNOSIS — G4700 Insomnia, unspecified: Secondary | ICD-10-CM | POA: Diagnosis not present

## 2020-08-04 DIAGNOSIS — H0012 Chalazion right lower eyelid: Secondary | ICD-10-CM

## 2020-08-04 DIAGNOSIS — B070 Plantar wart: Secondary | ICD-10-CM

## 2020-08-04 DIAGNOSIS — R32 Unspecified urinary incontinence: Secondary | ICD-10-CM

## 2020-08-04 DIAGNOSIS — I471 Supraventricular tachycardia: Secondary | ICD-10-CM

## 2020-08-04 DIAGNOSIS — F32A Depression, unspecified: Secondary | ICD-10-CM

## 2020-08-04 DIAGNOSIS — E860 Dehydration: Secondary | ICD-10-CM

## 2020-08-04 DIAGNOSIS — Y92009 Unspecified place in unspecified non-institutional (private) residence as the place of occurrence of the external cause: Secondary | ICD-10-CM

## 2020-08-04 DIAGNOSIS — R441 Visual hallucinations: Secondary | ICD-10-CM

## 2020-08-04 DIAGNOSIS — R634 Abnormal weight loss: Secondary | ICD-10-CM

## 2020-08-04 DIAGNOSIS — M25551 Pain in right hip: Secondary | ICD-10-CM

## 2020-08-04 DIAGNOSIS — W19XXXD Unspecified fall, subsequent encounter: Secondary | ICD-10-CM

## 2020-08-04 DIAGNOSIS — R399 Unspecified symptoms and signs involving the genitourinary system: Secondary | ICD-10-CM

## 2020-08-04 MED ORDER — NITROFURANTOIN MONOHYD MACRO 100 MG PO CAPS: 100.0000 mg | ORAL_CAPSULE | Freq: Two times a day (BID) | ORAL | 0 refills | Status: DC

## 2020-08-04 MED ORDER — PREDNISONE 50 MG PO TABS: 50.0000 mg | ORAL_TABLET | Freq: Every day | ORAL | 0 refills | Status: DC

## 2020-08-04 NOTE — Progress Notes (Signed)
Subjective:    CC: Chronic disease follow-up  HPI: Pleasant 60 year old female accompanied by her husband presenting today for follow-up on chronic disease and general health.  She has had a hard time over the last couple of weeks with her medical conditions.  Having difficulty with falls at home due to balance disturbances.  Had a recent fall where she was bruised on her buttocks.  Notes that she fell today at home in the closet and her husband had to come help her up.  After a fall approximately 1 week ago, she developed right anterior pain around the hip area that radiates both up and down and can be debilitating.  This pain only hits intermittently but when it does, she feels it necessary to lie down to get relief.  Reports the pain is bad enough it takes her breath.  Has a couple of plantar warts that are interfering with her ability to walk.  She has 1 on the bottom of each foot at the area of the first MTP joint.  They have tried to use pads on them but the warts have become very painful.  They do have an appointment with a podiatrist for evaluation and treatment but this is not until next month.  Interested in a referral that may get them an evaluation sooner.  Has not had any further issues with paroxysmal SVT or syncope.  She is working to stay well-hydrated and drinking at least 60+ ounces of water per day.  Has had some urinary symptoms for the past few days.  Reports experiencing frequency, urgency, and urinary odor but no burning.  Tried to provide a specimen today but the collection hat was too far forward and she was unable to get enough in it for a sample.  She has developed another stye on her right eye.  She noticed it coming up about 2 days ago.  Feels that her mood is overall good despite having a couple of bad weeks.  Notes that she does not want to be on mood altering medications anyway.  Has not had any issues with severe depression, suicidal ideation, homicidal ideation, or  any further visual hallucinations.  Notes that she has had a very good appetite lately and her husband is feeding her very well.  Previous issues with weight loss seem to have resolved.  She is at 129 pounds now which is a good weight for her.  I reviewed the past medical history, family history, social history, surgical history, and allergies today and no changes were needed.  Please see the problem list section below in epic for further details.  Past Medical History: Past Medical History:  Diagnosis Date  . Anxiety   . Depression   . Parkinson's disease ([place])   . Renal insufficiency   . Skin cancer    Past Surgical History: Past Surgical History:  Procedure Laterality Date  . MOHS SURGERY     Social History: Social History   Socioeconomic History  . Marital status: Married    Spouse name: Not on file  . Number of children: 2  . Years of education: Not on file  . Highest education level: Not on file  Occupational History  . Not on file  Tobacco Use  . Smoking status: Never Smoker  . Smokeless tobacco: Never Used  Substance and Sexual Activity  . Alcohol use: Yes    Comment: Occasionally  . Drug use: Never  . Sexual activity: Not Currently  Other Topics Concern  .  Not on file  Social History Narrative  . Not on file   Social Determinants of Health   Financial Resource Strain: Not on file  Food Insecurity: Not on file  Transportation Needs: Not on file  Physical Activity: Not on file  Stress: Not on file  Social Connections: Not on file   Family History: Family History  Problem Relation Age of Onset  . Lung cancer Mother   . Esophageal cancer Father    Allergies: No Known Allergies Medications: See med rec.  Review of Systems: See HPI for pertinent positives and negatives.   Objective:    General: Well Developed, well nourished, and in no acute distress.  Neuro: Alert and oriented x3.  HEENT: Normocephalic, atraumatic.  Skin: Warm and dry.   Scattered bruising to bilateral upper extremities.  Plantar wart to the right and left foot as noted above. Cardiac: Regular rate and rhythm, no murmurs rubs or gallops, no lower extremity edema.  Respiratory: Clear to auscultation bilaterally. Not using accessory muscles, speaking in full sentences.  Impression and Recommendations:    1. Paroxysmal SVT (supraventricular tachycardia) (HCC) Stable.  2. Right hip pain Suspect this is related to her recent falls and inflammation. Prednisone 50mg  x 5 day burst to see if this will help.    3. Generalized anxiety disorder 4. Depression, unspecified depression type Continue Sertraline, Wellbutrin, and Seroquel as prescribed. Very sparing use of Lorazepam for severe anxiety.   5. Urinary symptoms Treating empirically with macrobid BID x 5 days. If no resolution of symptoms, return for recollection of specimen.   6. Weight loss Stable.   7. Dehydration Continue to push oral fluid intake with a goal of at least 64 ounces daily.  9. Visual hallucinations Resolved.  10.  Stye of right upper eyelid Wash the affected eye with baby shampoo. Make sure to remove makeup completely every night before bed to help prevent recurrences.  11. Plantar warts Referring to podiatry. Make consider trying corn pads to provide relief from pain when walking.  - Ambulatory referral to Podiatry  12. Fall in home, subsequent encounter Advised to use her cane at home when ambulating since her balance is off right now. Stressed the importance of preventing falls and related injuries.  Return in about 3 months (around 11/04/2020) for chronic disease follow up. ___________________________________________ Clearnce Sorrel, DNP, APRN, FNP-BC [facility] [place]

## 2020-08-09 ENCOUNTER — Encounter: Payer: Self-pay | Admitting: Medical-Surgical

## 2020-08-10 ENCOUNTER — Telehealth: Payer: Self-pay

## 2020-08-10 NOTE — Telephone Encounter (Signed)
If the prednisone did not provide relief, repeating the burst is not indicated. Can you please clarify her symptoms and the location of the pain? We may need to get her back in for further evaluation. Thanks, Caryl Asp

## 2020-08-10 NOTE — Telephone Encounter (Signed)
LVMTRC (1st attempt)   

## 2020-08-10 NOTE — Telephone Encounter (Signed)
Pt called and LVM stating she is still having back pain. She finished the prednisone pack but it did not really help. She said she is only able to stand for a very short period of time and then the pain will have her doubled over. She is wanting to know if she should have another round of prednisone or if she can get something sent in for pain.

## 2020-08-11 ENCOUNTER — Other Ambulatory Visit: Payer: Self-pay | Admitting: Medical-Surgical

## 2020-08-11 MED ORDER — TIZANIDINE HCL 4 MG PO TABS: 4.0000 mg | ORAL_TABLET | Freq: Four times a day (QID) | ORAL | 0 refills | Status: DC | PRN

## 2020-08-11 MED ORDER — NAPROXEN 250 MG PO TABS: 250.0000 mg | ORAL_TABLET | Freq: Three times a day (TID) | ORAL | 0 refills | Status: DC | PRN

## 2020-08-11 NOTE — Telephone Encounter (Signed)
Sending in Tizanidine (muscle relaxer) and an anti-inflammatory to help with the pain. Definitely could be related to her fall. If no improvement in the next week or so, may need to do some imaging.

## 2020-08-11 NOTE — Telephone Encounter (Signed)
Pt states it is a stabbing pain all up and down her back on the right side. She said she is ok when she is sitting down but when she stands up it will "catch" and the pain will have her doubled over and she will have to sit back down or lay down. She said that when she sits/lays down that the pain will ease up. She is wondering if this may be muscle related from the fall she had. She rates her pain as a 2/10 when sitting down but rates the pain at a 78/10 when she stands up.

## 2020-08-11 NOTE — Telephone Encounter (Signed)
Patient aware of instructions/recommendations via voicemail. Instructed patient to call back if there are any questions or concerns.   

## 2020-08-17 ENCOUNTER — Telehealth: Payer: Self-pay

## 2020-08-17 NOTE — Telephone Encounter (Signed)
Left msg for patient that Caryl Asp stated patient would need a an appt for a full evaluation. She needed to call back and schedule an appt. Left office number.

## 2020-08-17 NOTE — Telephone Encounter (Signed)
Will need to see her back in the office for full evaluation.

## 2020-08-17 NOTE — Telephone Encounter (Signed)
Pt called and LVM stating that the muscle relaxer that was prescribed is not working. She is wanting to know if something else should be prescribed or if she should have x-rays done. Please advise.

## 2020-08-24 NOTE — Progress Notes (Deleted)
HPI: FU syncope and SVT.  Echocardiogram October 2021 showed ejection fraction 60 to 65%, mild mitral regurgitation.  Monitor October 2021 showed sinus rhythm with short bursts of SVT with the longest lasting 31.7 seconds, rare PACs and PVCs. Previous symptoms felt to be orthostatic mediated.  Since last seen,   Current Outpatient Medications  Medication Sig Dispense Refill  . amantadine (SYMMETREL) 100 MG capsule Take 100 mg by mouth 3 (three) times daily.    . BELSOMRA 10 MG TABS Take 1 tablet by mouth at bedtime. 30 minutes before getting into bed    . buPROPion (WELLBUTRIN XL) 150 MG 24 hr tablet Take 1 tablet (150 mg total) by mouth daily. 90 tablet 0  . carbidopa-levodopa (SINEMET IR) 25-100 MG tablet Take 1 tablet by mouth 5 (five) times daily.     Marland Kitchen estradiol-norethindrone (ACTIVELLA) 1-0.5 MG tablet Mimvey 1 mg-0.5 mg tablet  TAKE 1 TABLET EVERY DAY    . gabapentin (NEURONTIN) 300 MG capsule Take 2 capsules (600 mg total) by mouth 2 (two) times daily. 360 capsule 0  . LORazepam (ATIVAN) 0.5 MG tablet Take 1 tablet (0.5 mg total) by mouth 2 (two) times daily as needed for anxiety. Do not use for sleep.  Use sparingly due to high risk of dependency. 60 tablet 2  . naproxen (NAPROSYN) 250 MG tablet Take 1 tablet (250 mg total) by mouth 3 (three) times daily as needed. 60 tablet 0  . nitrofurantoin, macrocrystal-monohydrate, (MACROBID) 100 MG capsule Take 1 capsule (100 mg total) by mouth 2 (two) times daily. 10 capsule 0  . predniSONE (DELTASONE) 50 MG tablet Take 1 tablet (50 mg total) by mouth daily. 5 tablet 0  . QUEtiapine (SEROQUEL) 25 MG tablet Take 25 mg by mouth at bedtime.    . sertraline (ZOLOFT) 50 MG tablet TAKE 2 TABLETS BY MOUTH EVERY DAY 180 tablet 0  . tiZANidine (ZANAFLEX) 4 MG tablet Take 1 tablet (4 mg total) by mouth 4 (four) times daily as needed. 60 tablet 0  . trihexyphenidyl (ARTANE) 2 MG tablet Take 1 tablet by mouth 2 (two) times daily.     No current  facility-administered medications for this visit.     Past Medical History:  Diagnosis Date  . Anxiety   . Depression   . Parkinson's disease ([place])   . Renal insufficiency   . Skin cancer     Past Surgical History:  Procedure Laterality Date  . MOHS SURGERY      Social History   Socioeconomic History  . Marital status: Married    Spouse name: Not on file  . Number of children: 2  . Years of education: Not on file  . Highest education level: Not on file  Occupational History  . Not on file  Tobacco Use  . Smoking status: Never Smoker  . Smokeless tobacco: Never Used  Substance and Sexual Activity  . Alcohol use: Yes    Comment: Occasionally  . Drug use: Never  . Sexual activity: Not Currently  Other Topics Concern  . Not on file  Social History Narrative  . Not on file   Social Determinants of Health   Financial Resource Strain: Not on file  Food Insecurity: Not on file  Transportation Needs: Not on file  Physical Activity: Not on file  Stress: Not on file  Social Connections: Not on file  Intimate Partner Violence: Not on file    Family History  Problem Relation Age of  Onset  . Lung cancer Mother   . Esophageal cancer Father     ROS: no fevers or chills, productive cough, hemoptysis, dysphasia, odynophagia, melena, hematochezia, dysuria, hematuria, rash, seizure activity, orthopnea, PND, pedal edema, claudication. Remaining systems are negative.  Physical Exam: Well-developed well-nourished in no acute distress.  Skin is warm and dry.  HEENT is normal.  Neck is supple.  Chest is clear to auscultation with normal expansion.  Cardiovascular exam is regular rate and rhythm.  Abdominal exam nontender or distended. No masses palpated. Extremities show no edema. neuro grossly intact  ECG- personally reviewed  A/P  1 syncope-patient has had no recurrences.  I continue to stress the importance of hydration and increase sodium intake.  2 SVT-noted  on previous monitor but patient was asymptomatic with no palpitations or other symptoms.  We did not add beta-blockade as I was concerned about exacerbating orthostatic symptoms.  We will continue to follow.  Kirk Ruths, MD

## 2020-08-25 ENCOUNTER — Encounter: Payer: Self-pay | Admitting: Podiatry

## 2020-08-25 ENCOUNTER — Ambulatory Visit: Payer: BC Managed Care – PPO | Admitting: Podiatry

## 2020-08-25 ENCOUNTER — Other Ambulatory Visit: Payer: Self-pay

## 2020-08-25 DIAGNOSIS — L989 Disorder of the skin and subcutaneous tissue, unspecified: Secondary | ICD-10-CM | POA: Diagnosis not present

## 2020-08-25 DIAGNOSIS — M79672 Pain in left foot: Secondary | ICD-10-CM

## 2020-08-25 DIAGNOSIS — M79671 Pain in right foot: Secondary | ICD-10-CM | POA: Diagnosis not present

## 2020-09-01 ENCOUNTER — Encounter: Payer: Self-pay | Admitting: Medical-Surgical

## 2020-09-01 ENCOUNTER — Ambulatory Visit (INDEPENDENT_AMBULATORY_CARE_PROVIDER_SITE_OTHER): Payer: BC Managed Care – PPO

## 2020-09-01 ENCOUNTER — Ambulatory Visit: Payer: BC Managed Care – PPO | Admitting: Medical-Surgical

## 2020-09-01 ENCOUNTER — Ambulatory Visit (INDEPENDENT_AMBULATORY_CARE_PROVIDER_SITE_OTHER): Payer: BC Managed Care – PPO | Admitting: Medical-Surgical

## 2020-09-01 ENCOUNTER — Other Ambulatory Visit: Payer: Self-pay

## 2020-09-01 VITALS — BP 132/86 | HR 86 | Temp 98.5°F

## 2020-09-01 DIAGNOSIS — M25551 Pain in right hip: Secondary | ICD-10-CM

## 2020-09-01 DIAGNOSIS — G8929 Other chronic pain: Secondary | ICD-10-CM | POA: Diagnosis not present

## 2020-09-01 DIAGNOSIS — M5440 Lumbago with sciatica, unspecified side: Secondary | ICD-10-CM

## 2020-09-01 DIAGNOSIS — M545 Low back pain, unspecified: Secondary | ICD-10-CM

## 2020-09-01 MED ORDER — HYDROCODONE-ACETAMINOPHEN 5-325 MG PO TABS: 1.0000 | ORAL_TABLET | Freq: Four times a day (QID) | ORAL | 0 refills | Status: DC | PRN

## 2020-09-01 NOTE — Progress Notes (Signed)
Subjective:   Patient ID: Elaine Curry, female   DOB: 60 y.o.   MRN: [id number]   HPI 60 year old female presents today for concerns of 2 skin lesions upon her feet.  She said it hurts with pressure and walk on the areas.  Denies any swelling or redness or drainage.  She tried over-the-counter medication without any significant improvement.     Review of Systems  All other systems reviewed and are negative.   Past Medical History:  Diagnosis Date   Anxiety    Depression    Parkinson's disease ([place])    Renal insufficiency    Skin cancer     Past Surgical History:  Procedure Laterality Date   MOHS SURGERY       Current Outpatient Medications:    amantadine (SYMMETREL) 100 MG capsule, Take 100 mg by mouth 3 (three) times daily., Disp: , Rfl:    BELSOMRA 10 MG TABS, Take 1 tablet by mouth at bedtime. 30 minutes before getting into bed, Disp: , Rfl:    buPROPion (WELLBUTRIN XL) 150 MG 24 hr tablet, Take 1 tablet (150 mg total) by mouth daily., Disp: 90 tablet, Rfl: 0   carbidopa-levodopa (SINEMET IR) 25-100 MG tablet, Take 1 tablet by mouth 5 (five) times daily. , Disp: , Rfl:    estradiol-norethindrone (ACTIVELLA) 1-0.5 MG tablet, Mimvey 1 mg-0.5 mg tablet  TAKE 1 TABLET EVERY DAY, Disp: , Rfl:    gabapentin (NEURONTIN) 300 MG capsule, Take 2 capsules (600 mg total) by mouth 2 (two) times daily., Disp: 360 capsule, Rfl: 0   LORazepam (ATIVAN) 0.5 MG tablet, Take 1 tablet (0.5 mg total) by mouth 2 (two) times daily as needed for anxiety. Do not use for sleep.  Use sparingly due to high risk of dependency., Disp: 60 tablet, Rfl: 2   naproxen (NAPROSYN) 250 MG tablet, Take 1 tablet (250 mg total) by mouth 3 (three) times daily as needed., Disp: 60 tablet, Rfl: 0   nitrofurantoin, macrocrystal-monohydrate, (MACROBID) 100 MG capsule, Take 1 capsule (100 mg total) by mouth 2 (two) times daily., Disp: 10 capsule, Rfl: 0   predniSONE (DELTASONE) 50 MG tablet, Take 1 tablet (50 mg total) by  mouth daily., Disp: 5 tablet, Rfl: 0   QUEtiapine (SEROQUEL) 25 MG tablet, Take 25 mg by mouth at bedtime., Disp: , Rfl:    sertraline (ZOLOFT) 50 MG tablet, TAKE 2 TABLETS BY MOUTH EVERY DAY, Disp: 180 tablet, Rfl: 0   tiZANidine (ZANAFLEX) 4 MG tablet, Take 1 tablet (4 mg total) by mouth 4 (four) times daily as needed., Disp: 60 tablet, Rfl: 0   trihexyphenidyl (ARTANE) 2 MG tablet, Take 1 tablet by mouth 2 (two) times daily., Disp: , Rfl:   No Known Allergies       Objective:  Physical Exam  General: AAO x3, NAD  Dermatological: Hyperkeratotic lesions plantar feet submetatarsal 1.  Upon debridement there is no underlying ulceration drainage or any signs of infection.  No evidence of foreign body, redness or drainage or signs of infection.  Vascular: Dorsalis Pedis artery and Posterior Tibial artery pedal pulses are 2/4 bilateral with immedate capillary fill time. P There is no pain with calf compression, swelling, warmth, erythema.   Neruologic: Grossly intact via light touch bilateral.   Musculoskeletal: No gross boney pedal deformities bilateral. No pain, crepitus, or limitation noted with foot and ankle range of motion bilateral. Muscular strength 5/5 in all groups tested bilateral.  Gait: Unassisted, Nonantalgic.  Assessment:   Skin lesions bilaterally    Plan:  -Treatment options discussed including all alternatives, risks, and complications -Etiology of symptoms were discussed -Sharply debrided skin lesions x2 there are any complications or bleeding.  Appears to be more overall porokeratosis of the posterior verruca.  Recommend moisturizer and offloading daily. -She has been having back pain in the asked me for all medications for this.  Advised her to follow-up with primary care.  Trula Slade DPM

## 2020-09-01 NOTE — Progress Notes (Signed)
  Subjective:    CC: back pain  HPI: Pleasant 60 year old female accompanied by her brother presenting for evaluation of continued back pain. She was treated previously with prednisone burst followed by Tizanidine and Naproxen. These medications did nothing to help her pain. She continues to have severe pain along the right low back that radiates to the front of her hip/groin. Has had several falls and admits to landing hard on her hip several times. No numbness or tingling to the leg. No saddle paresthesias. Existing generalized weakness worsened by immobility so unsure if her inability to walk independently is related to the pain or to her overall condition. Pain is worse when walking. When the pain hits, she has to bend over and sit down, which relieves the pain.  I reviewed the past medical history, family history, social history, surgical history, and allergies today and no changes were needed.  Please see the problem list section below in epic for further details.  Past Medical History: Past Medical History:  Diagnosis Date   Anxiety    Depression    Parkinson's disease ([place])    Renal insufficiency    Skin cancer    Past Surgical History: Past Surgical History:  Procedure Laterality Date   MOHS SURGERY     Social History: Social History   Socioeconomic History   Marital status: Married    Spouse name: Not on file   Number of children: 2   Years of education: Not on file   Highest education level: Not on file  Occupational History   Not on file  Tobacco Use   Smoking status: Never   Smokeless tobacco: Never  Substance and Sexual Activity   Alcohol use: Yes    Comment: Occasionally   Drug use: Never   Sexual activity: Not Currently  Other Topics Concern   Not on file  Social History Narrative   Not on file   Social Determinants of Health   Financial Resource Strain: Not on file  Food Insecurity: Not on file  Transportation Needs: Not on file  Physical Activity:  Not on file  Stress: Not on file  Social Connections: Not on file   Family History: Family History  Problem Relation Age of Onset   Lung cancer Mother    Esophageal cancer Father    Allergies: No Known Allergies Medications: See med rec.  Review of Systems: See HPI for pertinent positives and negatives.   Objective:    General: Well Developed, well nourished, and in no acute distress.  Neuro: Alert and oriented x3.  HEENT: Normocephalic, atraumatic.  Skin: Warm and dry. Cardiac: Regular rate and rhythm, no murmurs rubs or gallops, no lower extremity edema.  Respiratory: Clear to auscultation bilaterally. Not using accessory muscles, speaking in full sentences.  Impression and Recommendations:    1. Chronic bilateral low back pain without sciatica 2. Right hip pain Due to limitation in mobility and weakness, unable to complete a thorough physical exam. After multiple falls and no response to prednisone, concern for possible occult fracture. Getting updated images of lumbar spine. Also getting hip x-rays today. Norco as needed for pain.  - DG Lumbar Spine Complete; Future - DG Hip Unilat W OR W/O Pelvis 2-3 Views Right; Future  Return for visit with Dr. Darene Lamer as soon as possible for hip/back pain. ___________________________________________ Clearnce Sorrel, DNP, APRN, FNP-BC [facility] [place]

## 2020-09-04 ENCOUNTER — Telehealth: Payer: Self-pay | Admitting: *Deleted

## 2020-09-04 ENCOUNTER — Ambulatory Visit (INDEPENDENT_AMBULATORY_CARE_PROVIDER_SITE_OTHER): Payer: BC Managed Care – PPO | Admitting: Sports Medicine

## 2020-09-04 ENCOUNTER — Other Ambulatory Visit: Payer: Self-pay | Admitting: Podiatry

## 2020-09-04 ENCOUNTER — Other Ambulatory Visit: Payer: Self-pay

## 2020-09-04 DIAGNOSIS — M545 Low back pain, unspecified: Secondary | ICD-10-CM | POA: Diagnosis not present

## 2020-09-04 MED ORDER — PREDNISONE 10 MG (48) PO TBPK: ORAL_TABLET | Freq: Every day | ORAL | 0 refills | Status: DC

## 2020-09-04 MED ORDER — OXYCODONE-ACETAMINOPHEN 5-325 MG PO TABS: 1.0000 | ORAL_TABLET | Freq: Three times a day (TID) | ORAL | 0 refills | Status: DC | PRN

## 2020-09-04 MED ORDER — CICLOPIROX 8 % EX SOLN: Freq: Every day | CUTANEOUS | 2 refills | Status: DC

## 2020-09-04 NOTE — Assessment & Plan Note (Signed)
Elaine Curry is a pleasant 60 year old female with Parkinson's disease, for the past month and a half she is had severe low back pain with radiation down the right leg in an L5 distribution, adding an additional 12-day taper of prednisone, increasing pain medicine to oxycodone. She needs to do some physical therapy, she is homebound with her Parkinson's so we will try to get home health PT. I like to see her back in about 4 weeks, we will do an MRI with high-dose triazolam if insufficient improvement, for epidural planning.

## 2020-09-04 NOTE — Progress Notes (Signed)
    Procedures performed today:    None.  Independent interpretation of notes and tests performed by another provider:   X-rays personally reviewed, multilevel lumbar DDD  Brief History, Exam, Impression, and Recommendations:    Acute left-sided low back pain without sciatica Elaine Curry is a pleasant 60 year old female with Parkinson's disease, for the past month and a half she is had severe low back pain with radiation down the right leg in an L5 distribution, adding an additional 12-day taper of prednisone, increasing pain medicine to oxycodone. She needs to do some physical therapy, she is homebound with her Parkinson's so we will try to get home health PT. I like to see her back in about 4 weeks, we will do an MRI with high-dose triazolam if insufficient improvement, for epidural planning.    ___________________________________________ [name]. Dianah Field, M.D., ABFM., CAQSM. [facility] and [place] Instructor of [place] [facility] Medicine

## 2020-09-04 NOTE — Telephone Encounter (Signed)
Patient is calling to request pain medicine for her right great toe,stated that she had discussed during office visit in [place]. Nothing documented in epic notes.Please advise.

## 2020-09-04 NOTE — Telephone Encounter (Signed)
Returned call to patient, no answer, left vmessage to call back to schedule a sooner appointment for her possible nail fungus from what last note stated. She told me that she was having toe pain.

## 2020-09-04 NOTE — Telephone Encounter (Signed)
Returned call to patient to give information per Dr Jacqualyn Posey concerning pain medicine for her great toe,no answer, left vmessage for call back.

## 2020-09-04 NOTE — Telephone Encounter (Signed)
Patient stated this is not about a callus. Patient states she has a toenail fungus and needs medication to treat the fungus. Please advise.

## 2020-09-06 ENCOUNTER — Ambulatory Visit: Payer: BC Managed Care – PPO | Admitting: Cardiology

## 2020-09-08 NOTE — Telephone Encounter (Signed)
Prescription for topical nail solution has been sent in to pharmacy on file.

## 2020-09-20 ENCOUNTER — Telehealth: Payer: Self-pay

## 2020-09-20 NOTE — Telephone Encounter (Signed)
Left detailed msg on VM. Questions/concerns call office at [phone]

## 2020-09-20 NOTE — Telephone Encounter (Signed)
Sounds like she is going to need an MRI, I will order this, this will be in anticipation of an epidural injection.

## 2020-09-20 NOTE — Telephone Encounter (Signed)
Patient called to report that she took the round of prednisone but the pain is just as bad. It did not help at all. What should she do now? Please advise.

## 2020-09-25 ENCOUNTER — Other Ambulatory Visit: Payer: Self-pay | Admitting: Medical-Surgical

## 2020-10-02 ENCOUNTER — Ambulatory Visit: Payer: BC Managed Care – PPO | Admitting: Sports Medicine

## 2020-10-04 ENCOUNTER — Other Ambulatory Visit: Payer: Self-pay

## 2020-10-04 ENCOUNTER — Encounter: Payer: Self-pay | Admitting: Medical-Surgical

## 2020-10-04 ENCOUNTER — Ambulatory Visit (INDEPENDENT_AMBULATORY_CARE_PROVIDER_SITE_OTHER): Payer: BC Managed Care – PPO | Admitting: Medical-Surgical

## 2020-10-04 VITALS — BP 111/70 | HR 93 | Temp 98.5°F

## 2020-10-04 DIAGNOSIS — L03116 Cellulitis of left lower limb: Secondary | ICD-10-CM | POA: Diagnosis not present

## 2020-10-04 DIAGNOSIS — E860 Dehydration: Secondary | ICD-10-CM | POA: Diagnosis not present

## 2020-10-04 DIAGNOSIS — R296 Repeated falls: Secondary | ICD-10-CM | POA: Diagnosis not present

## 2020-10-04 MED ORDER — CEPHALEXIN 500 MG PO CAPS: 500.0000 mg | ORAL_CAPSULE | Freq: Two times a day (BID) | ORAL | 0 refills | Status: AC

## 2020-10-04 MED ORDER — LORAZEPAM 0.5 MG PO TABS: 0.5000 mg | ORAL_TABLET | Freq: Two times a day (BID) | ORAL | 2 refills | Status: DC | PRN

## 2020-10-04 NOTE — Progress Notes (Signed)
Subjective:    CC: Frequent falls  HPI: Pleasant 60 year old female accompanied by her brother presenting today for evaluation after having several falls.  Notes that she fell in the bathroom 4 days ago.  She was sitting down on the toilet and lost her balance, falling into the bathtub.  She was stuck with her hips over the edge of the bathtub and her head into the bathtub itself with her legs on the bathroom floor.  She did not have the muscle strength to push herself up and unfortunately was stuck there for approximately 7 hours.  Her brother got worried when she did not answer her phone and came to check on her.  They were able to get her up and evaluated but she did note some skin abrasions over the bony prominences of her pelvis.  She has fallen several times over the last few days.  Her brother notes that she is falling almost every day now.  No serious injuries but she does have abrasions, bruises, and skin tears scattered over her body.  Notes that when she gets dehydrated, she does fall more often.  She has been trying to drink the recommended 60 to 64 ounces of fluids a day but admits that she forgets at times and sips small bits rather than drinking significant amounts.  She is urinating normally for her, little urination during the day but getting up frequently at night to go.  Has a wound on her left shin from wheelchair at home.  The wound has been there for at least 2 to 3 weeks and was originally much worse than the appearance today.  Notes that a week ago, there was purulent drainage from the wound and significant redness.  She did not report to the office for evaluation and did not use any specific treatments.  Today, she notes that the wound is looking much better than it was.  She has been leaving it open to air.  I reviewed the past medical history, family history, social history, surgical history, and allergies today and no changes were needed.  Please see the problem list section  below in epic for further details.  Past Medical History: Past Medical History:  Diagnosis Date   Anxiety    Depression    Parkinson's disease ([place])    Renal insufficiency    Skin cancer    Past Surgical History: Past Surgical History:  Procedure Laterality Date   MOHS SURGERY     Social History: Social History   Socioeconomic History   Marital status: Married    Spouse name: Not on file   Number of children: 2   Years of education: Not on file   Highest education level: Not on file  Occupational History   Not on file  Tobacco Use   Smoking status: Never   Smokeless tobacco: Never  Substance and Sexual Activity   Alcohol use: Yes    Comment: Occasionally   Drug use: Never   Sexual activity: Not Currently  Other Topics Concern   Not on file  Social History Narrative   Not on file   Social Determinants of Health   Financial Resource Strain: Not on file  Food Insecurity: Not on file  Transportation Needs: Not on file  Physical Activity: Not on file  Stress: Not on file  Social Connections: Not on file   Family History: Family History  Problem Relation Age of Onset   Lung cancer Mother    Esophageal cancer Father  Allergies: No Known Allergies Medications: See med rec.  Review of Systems: See HPI for pertinent positives and negatives.   Objective:    General: Well Developed, well nourished, and in no acute distress.  Neuro: Alert and oriented x3.  HEENT: Normocephalic, atraumatic.  Skin: Warm and dry.  Scabbed, superficial abrasions to bilateral hips over bony prominences.  Scabbed abrasion to the left shoulder.  Skin tear to the left forearm.  Several Band-Aids in place covering scratches.  Significant scattered bruising in various stages of healing to the bilateral forearms.  Large, deep wound to the left shin, wound bed black with deeper pockets of pink/red tissue visible.  Wound edges dry with surrounding erythema extending at least 2-3  cm. Cardiac: Regular rate and rhythm, no murmurs rubs or gallops, no lower extremity edema.  Respiratory: Clear to auscultation bilaterally. Not using accessory muscles, speaking in full sentences.  Impression and Recommendations:    1. Frequent falls 2. Dehydration Given her recent history and issues with getting enough fluids and, suspect dehydration is contributing to her frequent falls.  She does have quite a bit of neurological issues going on which is also contributing.  Discussed options for treatment and prevention.  We will go ahead and give her a liter of fluid today to see if this will help reduce her falls.  Patient is agreeable to the plan.  IV start attempt x3 using 22-gauge Angiocath.  First 2 sticks showed good blood return but the veins infiltrated quickly due to fragility.  Attempt successful in left hand.  1 L of normal saline infused via pressure bag.  IV removed and hemostasis achieved at the site.  Discussed the importance of fall prevention and measures to take at home.  With her current debility and neurological concerns, she is at very high risk for serious injury with falls.  Advised patient that should she be seriously injured after a fall, this may interfere with the evaluation for deep brain stimulator to address her Parkinson's symptoms.  Ideally, she would benefit from 24-hour supervision but this is not an option.  Home health PT is in place and patient has adaptive equipment already in the home.  Strongly advised her to use the adaptive equipment as intended and seek assistance with activities that may cause imbalance.  3. Cellulitis of left lower extremity Right shin wound very concerning in appearance today.  The wound is very dry so Xeroform placed over the site, covered with gauze, secured with rolled gauze and tape.  Supplies provided to patient to change the Xeroform gauze once every 24 hours.  Starting Keflex 500 mg twice daily x7 days.  We will bring her back in 1  week for evaluation.  With the depth of this injury, suspect she may need to be seen for wound care and possible debridement.  Return in about 1 week (around 10/11/2020) for Wound check.  50 minutes of time spent in face-to-face encounter with patient. ___________________________________________ Clearnce Sorrel, DNP, APRN, FNP-BC Primary Care and [place]

## 2020-10-06 ENCOUNTER — Telehealth: Payer: Self-pay

## 2020-10-06 NOTE — Telephone Encounter (Signed)
Rush Landmark, Altair's husband, called and left a message stating she is getting worse. She fell a couple more times last night and talked in her sleep. He tried to call her this morning but no answer. He stated he would leave work to check on her. I called him back and left a message to take her to the ED is she is worse. Provided a call back number if he wanted to speak with [place] first.

## 2020-10-11 ENCOUNTER — Other Ambulatory Visit: Payer: Self-pay

## 2020-10-11 ENCOUNTER — Ambulatory Visit (INDEPENDENT_AMBULATORY_CARE_PROVIDER_SITE_OTHER): Payer: BC Managed Care – PPO | Admitting: Medical-Surgical

## 2020-10-11 VITALS — BP 91/65 | HR 92 | Temp 97.7°F | Ht 66.0 in

## 2020-10-11 DIAGNOSIS — G20 Parkinson's disease: Secondary | ICD-10-CM

## 2020-10-11 DIAGNOSIS — L03116 Cellulitis of left lower limb: Secondary | ICD-10-CM | POA: Diagnosis not present

## 2020-10-11 DIAGNOSIS — R296 Repeated falls: Secondary | ICD-10-CM

## 2020-10-11 DIAGNOSIS — N1832 Chronic kidney disease, stage 3b: Secondary | ICD-10-CM | POA: Diagnosis not present

## 2020-10-11 NOTE — Progress Notes (Signed)
  HPI with pertinent ROS:   CC: Wound check  HPI: Pleasant 60 year old female accompanied by her brother presenting today for wound check of the left lower extremity wound that was noted 1 week ago.  She has been using Xeroform gauze to the site and keeping it wrapped as instructed.  They have changed his dressing every day make sure to wash the area with soap and water.  She has noted significant improvement in it and reports that the redness has continued to decrease.  She is still finishing up her course of antibiotics as prescribed and is tolerating those well.  Continues to have frequent falls, increased to almost daily over the last 3 weeks.  Physical stability is poor and she is getting weaker by the day.  Her husband does have to work and is unable to be with her 24 hours/day.  She does have her brother who comes and checks on her and can call and text but he is not able to be there all day.  Although she knows that she is a high fall risk, she does tend to get up and try to do her regular activities as she used to without realizing her strength is not up to the task.  I reviewed the past medical history, family history, social history, surgical history, and allergies today and no changes were needed.  Please see the problem list section below in epic for further details.   Physical exam:   General: Well Developed, well nourished, and in no acute distress.  Neuro: Alert and oriented x3. HEENT: Normocephalic, atraumatic.  Skin: Warm and dry.  Left shin wound looking good with no further necrotic tissue and improving erythema.  Wound edges are starting to close.  New skin tear to the right shin with small amount of bloody drainage. Cardiac: Regular rate and rhythm, no murmurs rubs or gallops, no lower extremity edema.  Respiratory: Clear to auscultation bilaterally. Not using accessory muscles, speaking in full sentences.  Impression and Recommendations:    1. Cellulitis of left lower  extremity Improving.  Continue to perform daily site care as instructed.  Finish antibiotic course as prescribed.  2. Frequent falls We will go ahead and check CBC with differential and TSH today.  Advised patient that frequent falls are a severe safety hazard and that the recommendation is that she should have 24-hour care at home.  Conversation held regarding acknowledging physical limitations to prevent harm. - CBC with Differential/Platelet - TSH  3. Stage 3b chronic kidney disease ([place]) Concern for dehydration or worsening kidney function.  Checking CMP today. - COMPLETE METABOLIC PANEL WITH GFR  Return in about 4 weeks (around 11/08/2020) for falls follow up. ___________________________________________ Clearnce Sorrel, DNP, APRN, FNP-BC Primary Care and [place]

## 2020-10-12 LAB — CBC WITH DIFFERENTIAL/PLATELET
Absolute Monocytes: 1470 cells/uL — ABNORMAL HIGH (ref 200–950)
Basophils Absolute: 105 cells/uL (ref 0–200)
Basophils Relative: 1 %
Eosinophils Absolute: 179 cells/uL (ref 15–500)
Eosinophils Relative: 1.7 %
HCT: 34.9 % — ABNORMAL LOW (ref 35.0–45.0)
Hemoglobin: 11.2 g/dL — ABNORMAL LOW (ref 11.7–15.5)
Lymphs Abs: 1355 cells/uL (ref 850–3900)
MCH: 31.5 pg (ref 27.0–33.0)
MCHC: 32.1 g/dL (ref 32.0–36.0)
MCV: 98 fL (ref 80.0–100.0)
MPV: 10.6 fL (ref 7.5–12.5)
Monocytes Relative: 14 %
Neutro Abs: 7392 cells/uL (ref 1500–7800)
Neutrophils Relative %: 70.4 %
Platelets: 496 10*3/uL — ABNORMAL HIGH (ref 140–400)
RBC: 3.56 10*6/uL — ABNORMAL LOW (ref 3.80–5.10)
RDW: 12.7 % (ref 11.0–15.0)
Total Lymphocyte: 12.9 %
WBC: 10.5 10*3/uL (ref 3.8–10.8)

## 2020-10-12 LAB — COMPLETE METABOLIC PANEL WITH GFR
AG Ratio: 1.2 (calc) (ref 1.0–2.5)
ALT: 6 U/L (ref 6–29)
AST: 27 U/L (ref 10–35)
Albumin: 3.5 g/dL — ABNORMAL LOW (ref 3.6–5.1)
Alkaline phosphatase (APISO): 99 U/L (ref 37–153)
BUN/Creatinine Ratio: 15 (calc) (ref 6–22)
BUN: 21 mg/dL (ref 7–25)
CO2: 26 mmol/L (ref 20–32)
Calcium: 9.6 mg/dL (ref 8.6–10.4)
Chloride: 105 mmol/L (ref 98–110)
Creat: 1.4 mg/dL — ABNORMAL HIGH (ref 0.50–1.03)
Globulin: 2.9 g/dL (calc) (ref 1.9–3.7)
Glucose, Bld: 105 mg/dL — ABNORMAL HIGH (ref 65–99)
Potassium: 4.4 mmol/L (ref 3.5–5.3)
Sodium: 142 mmol/L (ref 135–146)
Total Bilirubin: 0.6 mg/dL (ref 0.2–1.2)
Total Protein: 6.4 g/dL (ref 6.1–8.1)
eGFR: 43 mL/min/{1.73_m2} — ABNORMAL LOW (ref >=60)

## 2020-10-12 LAB — TSH: TSH: 1.21 mIU/L (ref 0.40–4.50)

## 2020-10-16 ENCOUNTER — Other Ambulatory Visit: Payer: Self-pay

## 2020-10-16 ENCOUNTER — Ambulatory Visit (INDEPENDENT_AMBULATORY_CARE_PROVIDER_SITE_OTHER): Payer: BC Managed Care – PPO | Admitting: Sports Medicine

## 2020-10-16 DIAGNOSIS — M5412 Radiculopathy, cervical region: Secondary | ICD-10-CM | POA: Diagnosis not present

## 2020-10-16 DIAGNOSIS — M545 Low back pain, unspecified: Secondary | ICD-10-CM

## 2020-10-16 MED ORDER — TRIAZOLAM 0.25 MG PO TABS: ORAL_TABLET | ORAL | 0 refills | Status: DC

## 2020-10-16 NOTE — Assessment & Plan Note (Signed)
Elaine Curry also has chronic neck pain, she gets trigger point injections from her neurologist. 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 She can return at my next available opening, she will take triazolam before. If ultimately we proceed with a cervical epidural I do think she would likely need general anesthesia.

## 2020-10-16 NOTE — Assessment & Plan Note (Signed)
Elaine Curry is a very pleasant 60 year old female, she has Parkinson's disease, at this juncture she has had 12 weeks of severe low back pain radiating down the right leg in an L5 distribution, this has not responded to prednisone, more recently oxycodone. She is had greater than 6 weeks of physician directed conservative treatment. At this juncture she will need an MRI of the lumbar spine for epidural planning which will likely be with Dr. Francesco Runner. Adding high-dose triazolam prior to the MRI as she does have choreoathetoid movements that would likely interfere with the picture. Return for MRI results.

## 2020-10-16 NOTE — Progress Notes (Signed)
    Procedures performed today:    None.  Independent interpretation of notes and tests performed by another provider:   None.  Brief History, Exam, Impression, and Recommendations:    Acute left-sided low back pain without sciatica Elaine Curry is a very pleasant 60 year old female, she has Parkinson's disease, at this juncture she has had 12 weeks of severe low back pain radiating down the right leg in an L5 distribution, this has not responded to prednisone, more recently oxycodone. She is had greater than 6 weeks of physician directed conservative treatment. At this juncture she will need an MRI of the lumbar spine for epidural planning which will likely be with Dr. Francesco Runner. Adding high-dose triazolam prior to the MRI as she does have choreoathetoid movements that would likely interfere with the picture. Return for MRI results.  Cervical radiculopathy Elaine Curry also has chronic neck pain, she gets trigger point injections from her neurologist. 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 She can return at my next available opening, she will take triazolam before. If ultimately we proceed with a cervical epidural I do think she would likely need general anesthesia.    ___________________________________________ [name]. Dianah Field, M.D., ABFM., CAQSM. Primary Care and [place] Instructor of [place] of [facility] of [facility]

## 2020-10-17 ENCOUNTER — Telehealth: Payer: Self-pay

## 2020-10-17 NOTE — Telephone Encounter (Signed)
Elaine Curry called to get VO to continue PT for patient. Patient was seen yesterday.

## 2020-10-17 NOTE — Telephone Encounter (Signed)
Verbal order given  

## 2020-10-17 NOTE — Telephone Encounter (Signed)
VO given to [place].

## 2020-10-25 ENCOUNTER — Ambulatory Visit: Payer: BC Managed Care – PPO | Admitting: Sports Medicine

## 2020-10-27 ENCOUNTER — Ambulatory Visit: Payer: BC Managed Care – PPO | Admitting: Podiatry

## 2020-10-28 ENCOUNTER — Other Ambulatory Visit: Payer: Self-pay

## 2020-10-28 ENCOUNTER — Other Ambulatory Visit: Payer: Self-pay | Admitting: Medical-Surgical

## 2020-10-28 ENCOUNTER — Ambulatory Visit (INDEPENDENT_AMBULATORY_CARE_PROVIDER_SITE_OTHER): Payer: BC Managed Care – PPO

## 2020-10-28 ENCOUNTER — Other Ambulatory Visit: Payer: BC Managed Care – PPO

## 2020-10-28 DIAGNOSIS — M545 Low back pain, unspecified: Secondary | ICD-10-CM

## 2020-10-30 ENCOUNTER — Encounter: Payer: Self-pay | Admitting: Neurology

## 2020-10-30 MED ORDER — BUPROPION HCL ER (XL) 150 MG PO TB24: 150.0000 mg | ORAL_TABLET | Freq: Every day | ORAL | 0 refills | Status: DC

## 2020-10-31 ENCOUNTER — Other Ambulatory Visit: Payer: Self-pay | Admitting: Sports Medicine

## 2020-10-31 DIAGNOSIS — M545 Low back pain, unspecified: Secondary | ICD-10-CM

## 2020-10-31 DIAGNOSIS — M4726 Other spondylosis with radiculopathy, lumbar region: Secondary | ICD-10-CM

## 2020-11-03 ENCOUNTER — Telehealth: Payer: Self-pay

## 2020-11-03 ENCOUNTER — Ambulatory Visit: Payer: BC Managed Care – PPO | Admitting: Sports Medicine

## 2020-11-03 NOTE — Telephone Encounter (Signed)
Patient thought Dr. Darene Lamer was going to do her epidural. She was confused when she got a call from [facility] although she scheduled with them for next week.   Left msg for a return call.

## 2020-11-07 ENCOUNTER — Other Ambulatory Visit: Payer: Self-pay

## 2020-11-08 ENCOUNTER — Ambulatory Visit: Payer: BC Managed Care – PPO | Admitting: Medical-Surgical

## 2020-11-10 ENCOUNTER — Encounter: Payer: Self-pay | Admitting: Medical-Surgical

## 2020-11-10 ENCOUNTER — Other Ambulatory Visit: Payer: Self-pay

## 2020-11-10 ENCOUNTER — Ambulatory Visit (INDEPENDENT_AMBULATORY_CARE_PROVIDER_SITE_OTHER): Payer: BC Managed Care – PPO | Admitting: Sports Medicine

## 2020-11-10 ENCOUNTER — Ambulatory Visit (INDEPENDENT_AMBULATORY_CARE_PROVIDER_SITE_OTHER): Payer: BC Managed Care – PPO | Admitting: Medical-Surgical

## 2020-11-10 VITALS — BP 110/73 | HR 86 | Resp 20

## 2020-11-10 DIAGNOSIS — M5412 Radiculopathy, cervical region: Secondary | ICD-10-CM

## 2020-11-10 DIAGNOSIS — F411 Generalized anxiety disorder: Secondary | ICD-10-CM | POA: Diagnosis not present

## 2020-11-10 DIAGNOSIS — F32A Depression, unspecified: Secondary | ICD-10-CM | POA: Diagnosis not present

## 2020-11-10 DIAGNOSIS — M545 Low back pain, unspecified: Secondary | ICD-10-CM | POA: Diagnosis not present

## 2020-11-10 DIAGNOSIS — R928 Other abnormal and inconclusive findings on diagnostic imaging of breast: Secondary | ICD-10-CM

## 2020-11-10 MED ORDER — TRIAZOLAM 0.25 MG PO TABS: ORAL_TABLET | ORAL | 0 refills | Status: DC

## 2020-11-10 NOTE — Progress Notes (Signed)
    Procedures performed today:    Procedure:  Injection of left and right mastoid process, right paracervical trigger points Consent obtained and verified. Time-out conducted. Noted no overlying erythema, induration, or other signs of local infection. Skin prepped in a sterile fashion. Topical analgesic spray: Ethyl chloride. Completed without difficulty. Meds: A total of 1 cc kenalog 40, 4 cc lidocaine spread out between the 3 trigger points. Advised to call if fevers/chills, erythema, induration, drainage, or persistent bleeding.  Independent interpretation of notes and tests performed by another provider:   None.  Brief History, Exam, Impression, and Recommendations:    Cervical radiculopathy Elaine Curry has chronic neck pain, we did 3 trigger point injections today, one of the sternocleidomastoid injection at the mastoid process bilaterally and 1 in the right paracervical musculature at about C7. 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    ___________________________________________ [name]. Dianah Field, M.D., ABFM., CAQSM. Primary Care and [place] Instructor of [place] of [facility] Medicine

## 2020-11-10 NOTE — Assessment & Plan Note (Signed)
Elaine Curry has chronic neck pain, we did 3 trigger point injections today, one of the sternocleidomastoid injection at the mastoid process bilaterally and 1 in the right paracervical musculature at about C7. 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

## 2020-11-10 NOTE — Progress Notes (Signed)
  HPI with pertinent ROS:   CC: Mood follow-up  HPI: Pleasant 60 year old female accompanied by her sister-in-law presenting today for mood follow-up.  She has been doing fairly well over the past several weeks but the last 1 to 2 weeks has been much better since stopping gabapentin as well as Artane.  They made the decision to stop both of these medications as they do have some effect on her excessive sleepiness during the day and recent weakness/falls.  She is taking sertraline 100 mg daily along with Seroquel 25 mg every morning.  She also takes Wellbutrin 150 mg daily.  She does still use lorazepam 0.5 mg twice daily as needed/tries to avoid taking it twice daily every day.  Overall feels better but reports that her husband is very frustrated with her health situation and her tendency to be independent when she is not capable of doing so safely.  They are having some disagreements which has increased her stress recently.  On a good note, she does have follow-up scheduled with neurology in 2 different places to discuss her symptoms and possible treatments and qualification for a deep brain stimulator.  I reviewed the past medical history, family history, social history, surgical history, and allergies today and no changes were needed.  Please see the problem list section below in epic for further details.   Physical exam:   General: Well Developed, well nourished, and in no acute distress.  Neuro: Alert and oriented x3.  HEENT: Normocephalic, atraumatic.  Skin: Warm and dry. Cardiac: Regular rate and rhythm, no murmurs rubs or gallops, no lower extremity edema.  Respiratory: Clear to auscultation bilaterally. Not using accessory muscles, speaking in full sentences.  Impression and Recommendations:    1. Abnormal mammogram Due to her recent abnormal mammogram, she is due for referral to the breast center for follow-up.  Entering external orders for diagnostic mammogram and ultrasound to be  performed at [facility] per patient request. - MM DIAG BREAST TOMO BILATERAL; Future - US BREAST COMPLETE UNI LEFT INC AXILLA; Future - US BREAST COMPLETE UNI RIGHT INC AXILLA; Future  2. Generalized anxiety disorder 3. Depression, unspecified depression type Continue Wellbutrin 150 mg daily, sertraline 100 mg daily as prescribed.  Okay to continue sparing use of lorazepam 0.5 mg twice daily as needed but use only for severe anxiety.  Strongly recommend counseling but patient declines.  Return in about 2 months (around 01/10/2021) for mood follow up. ___________________________________________ Clearnce Sorrel, DNP, APRN, FNP-BC Primary Care and [place]

## 2020-11-15 ENCOUNTER — Inpatient Hospital Stay: Admission: RE | Admit: 2020-11-15 | Payer: BC Managed Care – PPO | Source: Ambulatory Visit

## 2020-11-20 ENCOUNTER — Ambulatory Visit: Payer: BC Managed Care – PPO | Admitting: Cardiology

## 2020-11-21 ENCOUNTER — Telehealth: Payer: Self-pay

## 2020-11-21 MED ORDER — FLUCONAZOLE 150 MG PO TABS: 150.0000 mg | ORAL_TABLET | Freq: Once | ORAL | 0 refills | Status: DC

## 2020-11-21 NOTE — Telephone Encounter (Signed)
Single dose Diflucan sent to the pharmacy for her since she is not able to come today.  She should take this medication as soon as she gets it.  If no improvement in her symptoms after 72 hours, we will need to see her in office for any further treatment so we can do a swab.  Clearnce Sorrel, DNP, APRN, FNP-BC [place] [facility] and [facility]

## 2020-11-21 NOTE — Telephone Encounter (Signed)
Pt informed of RX and instructions to make OV if no improvement in symptoms within 72 hours of taking the medication.  Pt expressed understanding and is agreeable.  Charyl Bigger, CMA

## 2020-11-21 NOTE — Telephone Encounter (Signed)
Pt called and states that she has a yeast infection from taking the cephalexin given to her on 10/04/20. I recommended she come in for a self swab and she stated she is not able to come in today.   As a side note, pt was tearful on the phone and states she saw Neuro at [facility] yesterday and was dx with MSA.

## 2020-11-22 ENCOUNTER — Ambulatory Visit
Admission: RE | Admit: 2020-11-22 | Discharge: 2020-11-22 | Disposition: A | Discharge status: Home / Self Care | Payer: BC Managed Care – PPO | Source: Ambulatory Visit | Attending: Sports Medicine | Admitting: Sports Medicine

## 2020-11-22 ENCOUNTER — Inpatient Hospital Stay: Admission: RE | Admit: 2020-11-22 | Payer: BC Managed Care – PPO | Source: Ambulatory Visit

## 2020-11-22 ENCOUNTER — Other Ambulatory Visit: Payer: Self-pay

## 2020-11-22 DIAGNOSIS — M545 Low back pain, unspecified: Secondary | ICD-10-CM

## 2020-11-22 MED ORDER — IOPAMIDOL (ISOVUE-M 200) INJECTION 41%
1.0000 mL | Freq: Once | INTRAMUSCULAR | Status: AC
  Administered 2020-11-22: 1 mL via EPIDURAL

## 2020-11-22 MED ORDER — METHYLPREDNISOLONE ACETATE 40 MG/ML INJ SUSP (RADIOLOG
80.0000 mg | Freq: Once | INTRAMUSCULAR | Status: AC
  Administered 2020-11-22: 80 mg via EPIDURAL

## 2020-11-22 NOTE — Discharge Instructions (Signed)

## 2020-11-23 ENCOUNTER — Encounter: Payer: Self-pay | Admitting: Medical-Surgical

## 2020-11-23 DIAGNOSIS — G232 Striatonigral degeneration: Secondary | ICD-10-CM

## 2020-12-04 DIAGNOSIS — Z006 Encounter for examination for normal comparison and control in clinical research program: Secondary | ICD-10-CM | POA: Insufficient documentation

## 2020-12-25 ENCOUNTER — Telehealth: Payer: Self-pay

## 2020-12-25 NOTE — Telephone Encounter (Signed)
Just fyi:  Patient called and wanted to advise she had epidural and it did not work. She states she is having extreme back pain . She is scheduled to come in 10/12

## 2020-12-28 ENCOUNTER — Encounter: Payer: Self-pay | Admitting: Family Medicine

## 2020-12-28 ENCOUNTER — Telehealth (INDEPENDENT_AMBULATORY_CARE_PROVIDER_SITE_OTHER): Payer: BC Managed Care – PPO | Admitting: Family Medicine

## 2020-12-28 ENCOUNTER — Other Ambulatory Visit: Payer: Self-pay | Admitting: Medical-Surgical

## 2020-12-28 VITALS — Wt 115.0 lb

## 2020-12-28 DIAGNOSIS — U071 COVID-19: Secondary | ICD-10-CM | POA: Diagnosis not present

## 2020-12-28 MED ORDER — BENZONATATE 100 MG PO CAPS: 100.0000 mg | ORAL_CAPSULE | Freq: Two times a day (BID) | ORAL | 0 refills | Status: AC | PRN

## 2020-12-28 MED ORDER — MOLNUPIRAVIR EUA 200MG CAPSULE: 4.0000 | ORAL_CAPSULE | Freq: Two times a day (BID) | ORAL | 0 refills | Status: AC

## 2020-12-28 NOTE — Patient Instructions (Signed)
Molnupiravir patient fact sheet: http://www.craig-choi.com/   Over the counter medications that may be helpful for symptoms:  Guaifenesin 1200 mg extended release tabs twice daily, with plenty of water For cough and congestion Brand name: Mucinex   Pseudoephedrine 30 mg, one or two tabs every 4 to 6 hours For sinus congestion Brand name: Sudafed You must get this from the pharmacy counter.  Oxymetazoline nasal spray each morning, one spray in each nostril, for NO MORE THAN 3 days  For nasal and sinus congestion Brand name: Afrin Saline nasal spray or Saline Nasal Irrigation 3-5 times a day For nasal and sinus congestion Brand names: Ocean or AYR Fluticasone nasal spray, one spray in each nostril, each morning (after oxymetazoline and saline, if used) For nasal and sinus congestion Brand name: Flonase Warm salt water gargles  For sore throat Every few hours as needed Alternate ibuprofen 400-600 mg and acetaminophen 1000 mg every 4-6 hours For fever, body aches, headache Brand names: Motrin or Advil and Tylenol Dextromethorphan 12-hour cough version 30 mg every 12 hours  For cough Brand name: Delsym Stop all other cold medications for now (Nyquil, Dayquil, Tylenol Cold, Theraflu, etc) and other non-prescription cough/cold preparations. Many of these have the same ingredients listed above and could cause an overdose of medication.   Herbal treatments that have been shown to be helpful in some patients include: Vitamin C 1000mg  per day Vitamin D 4000iU per day Zinc 100mg  per day Quercetin 25-500mg  twice a day Melatonin 5-10mg  at bedtime  General Instructions Allow your body to rest Drink PLENTY of fluids Isolate yourself from everyone, even family, until test results have returned  If your COVID-19 test is positive Then you ARE INFECTED and you can pass the virus to others You must quarantine from others for a minimum of  5 days since symptoms started  AND You are fever free for 24 hours WITHOUT any medication to reduce fever AND Your symptoms are improving Wear mask for the next 5 days If not improved by day 5, quarantine for the full 10 days. Do not go to the store or other public areas Do not go around household members who are not known to be infected with COVID-19 If you MUST leave your area of quarantine (example: go to a bathroom you share with others in your home), you must Wear a mask Wash your hands thoroughly Wipe down any surfaces you touch Do not share food, drinks, towels, or other items with other persons Dispose of your own tissues, food containers, etc  Once you have recovered, please continue good preventive care measures, including:  wearing a mask when in public wash your hands frequently avoid touching your face/nose/eyes cover coughs/sneezes with the inside of your elbow stay out of crowds keep a 6 foot distance from others  If you develop severe shortness of breath, uncontrolled fevers, coughing up blood, confusion, chest pain, or signs of dehydration (such as significantly decreased urine amounts or dizziness with standing) please go to the ER.

## 2020-12-28 NOTE — Progress Notes (Signed)
Virtual Visit via Telephone Note  I connected with  Elaine Curry on 12/28/20 at 11:10 AM EDT by telephone and verified that I am speaking with the correct person using two identifiers.   I discussed the limitations, risks, security and privacy concerns of performing an evaluation and management service by telephone and the availability of in person appointments. I also discussed with the patient that there may be a patient responsible charge related to this service. The patient expressed understanding and agreed to proceed.  Participating parties included in this telephone visit include: The patient and the nurse practitioner listed.  The patient is: At home I am: In the office  Subjective:    CC: COVID  HPI: Elaine Curry is a 60 y.o. year old female presenting today via telephone visit to discuss COVID+.  Patient states that night before last she started feeling poorly after her husband recently had COVID. She took a home test yesterday which was positive. She is reports productive cough, sore throat, body aches, fevers, chills, sweats, shortness of breath at times, headaches. She denies any chest pain, nausea, vomiting, diarrhea, signs of dehydration. She has been taking tylenol for some relief but it doesn't seem to be helping much.   History significant for Parkinson's, renal insufficiency, anxiety/depression, anemia.  Past medical history, Surgical history, Family history not pertinant except as noted below, Social history, Allergies, and medications have been entered into the medical record, reviewed, and corrections made.   Review of Systems:  All review of systems negative except what is listed in the HPI  Objective:    General:  Patient speaking clearly in complete sentences. No shortness of breath noted.   Alert and oriented x3.   Normal judgment.  No apparent acute distress.  Impression and Recommendations:    1. COVID-19 - molnupiravir EUA (LAGEVRIO) 200 mg CAPS  capsule; Take 4 capsules (800 mg total) by mouth 2 (two) times daily for 5 days.  Dispense: 40 capsule; Refill: 0 - benzonatate (TESSALON) 100 MG capsule; Take 1 capsule (100 mg total) by mouth 2 (two) times daily as needed for cough.  Dispense: 20 capsule; Refill: 0  Patient interested in trying antiviral. Discussed with patient and education link attached to AVS. Sending in molnupiravir due to her renal function. Adding tessalon for cough. List of OTC medications and quarantine guidelines attached to AVS. Patient aware of signs/symptoms requiring further/urgent evaluation.  Follow-up if symptoms worsen or fail to improve.     I discussed the assessment and treatment plan with the patient. The patient was provided an opportunity to ask questions and all were answered. The patient agreed with the plan and demonstrated an understanding of the instructions.   The patient was advised to call back or seek an in-person evaluation if the symptoms worsen or if the condition fails to improve as anticipated.  I provided 20 minutes of non-face-to-face time during this TELEPHONE encounter.    Terrilyn Saver, NP

## 2021-01-03 ENCOUNTER — Ambulatory Visit: Payer: BC Managed Care – PPO | Admitting: Sports Medicine

## 2021-01-05 ENCOUNTER — Ambulatory Visit: Payer: BC Managed Care – PPO | Admitting: Podiatry

## 2021-01-05 ENCOUNTER — Ambulatory Visit: Payer: BC Managed Care – PPO | Admitting: Neurology

## 2021-01-10 ENCOUNTER — Other Ambulatory Visit: Payer: Self-pay

## 2021-01-10 ENCOUNTER — Ambulatory Visit (INDEPENDENT_AMBULATORY_CARE_PROVIDER_SITE_OTHER): Payer: BC Managed Care – PPO | Admitting: Medical-Surgical

## 2021-01-10 ENCOUNTER — Encounter: Payer: Self-pay | Admitting: Medical-Surgical

## 2021-01-10 VITALS — BP 132/74 | HR 86 | Resp 20 | Ht 66.0 in

## 2021-01-10 DIAGNOSIS — R058 Other specified cough: Secondary | ICD-10-CM | POA: Diagnosis not present

## 2021-01-10 DIAGNOSIS — F324 Major depressive disorder, single episode, in partial remission: Secondary | ICD-10-CM

## 2021-01-10 DIAGNOSIS — G232 Striatonigral degeneration: Secondary | ICD-10-CM

## 2021-01-10 DIAGNOSIS — F411 Generalized anxiety disorder: Secondary | ICD-10-CM

## 2021-01-10 MED ORDER — GUAIFENESIN ER 600 MG PO TB12: 600.0000 mg | ORAL_TABLET | Freq: Two times a day (BID) | ORAL | 0 refills | Status: AC

## 2021-01-10 NOTE — Progress Notes (Signed)
HPI with pertinent ROS:   CC: mood follow up  HPI: Pleasant 60 year old female accompanied by her husband presenting today for mood follow up. She has been taking Zoloft 100mg  daily along with Wellbutrin XL 150mg  daily, tolerating both medications well without side effects. Due to her recent diagnosis of MSA and the rapid decline in her health, she has been struggling with coping. She is very frustrated that she is no longer independent for even the small things. Having difficulty sleeping which does not help with her levels of anxiety. They are working on getting Eugenie Birks drawn up and preparations in place for the progression of her disease. Knowing that this condition is terminal and untreatable is bringing about anticipatory grief and she is quite tearful at times. Notes her siblings are in denial about her condition and the eventual outcome. They do spend time with her which is helpful and serves as a distraction for her. She is not doing counseling at this point and does not feel that there is anything a counselor could do for her given the situation. Is still working with the neurologist at [facility] on symptom management. Notes that they had put in a Palliative/Hospice referral in but since they are in [place], it would need to be sent somewhere closer to home. They have not heard from this yet. Given the situation, she is requesting that her prn Ativan be increased to 0.5-1mg  daily as needed. Has been using it sparingly and no more than once daily for severe anxiety.   Continues to have a cough since having COVID. Feels that there is congestion that she needs to cough up. Has not found anything that is helpful. Admits that she got choked a few days ago where she could feel the muscles in her throat tighten up. This was quite scary for her and she is worried that it will happen again.   I reviewed the past medical history, family history, social history, surgical history, and allergies today and no changes  were needed.  Please see the problem list section below in epic for further details.  Depression screen [facility] 2/9 01/10/2021 11/10/2020 10/05/2019  Decreased Interest 1 1 3   Down, Depressed, Hopeless 1 1 3   PHQ - 2 Score 2 2 6   Altered sleeping 1 2 3   Tired, decreased energy 3 2 3   Change in appetite 0 2 2  Feeling bad or failure about yourself  0 - 2  Trouble concentrating 0 1 -  Moving slowly or fidgety/restless 2 1 -  Suicidal thoughts 0 0 3  PHQ-9 Score 8 10 19   Difficult doing work/chores Somewhat difficult Somewhat difficult Somewhat difficult   GAD 7 : Generalized Anxiety Score 01/10/2021 11/10/2020 10/05/2019  Nervous, Anxious, on Edge 3 2 2   Control/stop worrying 1 2 2   Worry too much - different things 1 2 2   Trouble relaxing 1 1 2   Restless 2 0 2  Easily annoyed or irritable 0 2 2  Afraid - awful might happen 0 0 2  Total GAD 7 Score 8 9 14   Anxiety Difficulty Somewhat difficult Somewhat difficult -    Physical exam:   General: Well Developed, poorly nourished, pale, and in no acute distress.  Neuro: Alert and oriented x3. Significant tremors noted to all extremities. HEENT: Normocephalic, atraumatic.  Skin: Warm and dry. Cardiac: Regular rate and rhythm, no murmurs rubs or gallops, no lower extremity edema.  Respiratory: Clear to auscultation bilaterally. Not using accessory muscles, speaking in full sentences.  Impression and Recommendations:    1. Multiple system atrophy, Parkinson variant ([place]) New referral entered for palliative/hospice care.  - Amb Referral to Palliative Care  2. 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 Discussed use of Lorazepam Given her situation, I feel that it is appropriate to consider an increase in dose to 0.5-1mg  once daily prn. Advised to monitor for falls and altered mental status.   4. Post-viral cough syndrome Exam normal today with  clear lungs. Recommend adding Mucinex BID and working to stay well hydrated. Reviewed the risk for aspiration with her rapid neurological changes. Advised to seek immediate evaluation should she aspirate or if her current symptoms worsen.   Return in about 3 months (around 04/12/2021) for chronic disease follow up. ___________________________________________ Clearnce Sorrel, DNP, APRN, FNP-BC Primary Care and [place]

## 2021-01-12 ENCOUNTER — Encounter: Payer: Self-pay | Admitting: Podiatry

## 2021-01-12 ENCOUNTER — Ambulatory Visit: Payer: BC Managed Care – PPO | Admitting: Podiatry

## 2021-01-12 ENCOUNTER — Ambulatory Visit (INDEPENDENT_AMBULATORY_CARE_PROVIDER_SITE_OTHER): Payer: BC Managed Care – PPO | Admitting: Sports Medicine

## 2021-01-12 ENCOUNTER — Other Ambulatory Visit: Payer: Self-pay

## 2021-01-12 ENCOUNTER — Ambulatory Visit (INDEPENDENT_AMBULATORY_CARE_PROVIDER_SITE_OTHER): Payer: BC Managed Care – PPO | Admitting: Podiatry

## 2021-01-12 ENCOUNTER — Other Ambulatory Visit: Payer: Self-pay | Admitting: Medical-Surgical

## 2021-01-12 DIAGNOSIS — M4726 Other spondylosis with radiculopathy, lumbar region: Secondary | ICD-10-CM | POA: Diagnosis not present

## 2021-01-12 DIAGNOSIS — Q828 Other specified congenital malformations of skin: Secondary | ICD-10-CM | POA: Diagnosis not present

## 2021-01-12 DIAGNOSIS — B351 Tinea unguium: Secondary | ICD-10-CM

## 2021-01-12 MED ORDER — DOCUSATE SODIUM 100 MG PO CAPS: 100.0000 mg | ORAL_CAPSULE | Freq: Three times a day (TID) | ORAL | 3 refills | Status: AC | PRN

## 2021-01-12 MED ORDER — OXYCODONE-ACETAMINOPHEN 10-325 MG PO TABS
1.0000 | ORAL_TABLET | Freq: Three times a day (TID) | ORAL | 0 refills | Status: AC
Start: 2021-01-12 — End: ?

## 2021-01-12 NOTE — Assessment & Plan Note (Signed)
This is a very pleasant 60 year old female, multiple sclerosis/Parkinson's disease, we tried a lumbar epidural, she had no relief, not even temporary. MRI did show degenerative disc disease without any obvious neuroforaminal stenosis. Sounds like she is currently on palliative care, major complaint is debilitating pain. She was given tizanidine which has not helped.  Discontinue this. We have not helped her yet with conditioning, steroids, I think we need pain management, I am going to increase oxycodone to 10/325 3 times daily with Colace 3 times daily. I would like pain management to weigh in as well.

## 2021-01-12 NOTE — Progress Notes (Signed)
    Procedures performed today:    None.  Independent interpretation of notes and tests performed by another provider:   None.  Brief History, Exam, Impression, and Recommendations:    Other spondylosis with radiculopathy, lumbar region This is a very pleasant 60 year old female, multiple sclerosis/Parkinson's disease, we tried a lumbar epidural, she had no relief, not even temporary. MRI did show degenerative disc disease without any obvious neuroforaminal stenosis. Sounds like she is currently on palliative care, major complaint is debilitating pain. She was given tizanidine which has not helped.  Discontinue this. We have not helped her yet with conditioning, steroids, I think we need pain management, I am going to increase oxycodone to 10/325 3 times daily with Colace 3 times daily. I would like pain management to weigh in as well.    ___________________________________________ [name]. Dianah Field, M.D., ABFM., CAQSM. Primary Care and [place] Instructor of [employer] [facility] [employer]

## 2021-01-12 NOTE — Progress Notes (Signed)
  Subjective:  Patient ID: Elaine Curry, female    DOB: 1960/04/28,   MRN: [id number]  No chief complaint on file.   60 y.o. female presents for follow-up of skin lesions on her feet that hurt with walking. Hoping to have them trimmed . Also has concern for fungus in left big toe.  Denies any other pedal complaints. Denies n/v/f/c.   Past Medical History:  Diagnosis Date   Anxiety    Depression    Parkinson's disease ([place])    Renal insufficiency    Skin cancer     Objective:  Physical Exam: Vascular: DP/PT pulses 2/4 bilateral. CFT <3 seconds. Normal hair growth on digits. No edema.  Skin. No lacerations or abrasions bilateral feet. Hyperkeratotic lesions sub first metatarsal bilateral. Left hallux nail thickened and discolored with subungual debris. Nails 1-5 are thickened discolored and elongated with subungual debris.   Musculoskeletal: MMT 5/5 bilateral lower extremities in DF, PF, Inversion and Eversion. Deceased ROM in DF of ankle joint.  Neurological: Sensation intact to light touch.   Assessment:   1. Porokeratosis   2. Onychomycosis      Plan:  Patient was evaluated and treated and all questions answered. -Discussed porokeratosis with patient and treatment options.  -Hyperkeratotic tissue was debrided with chisel without incident.  -Mechanically debrided all nails 1-5 bilateral using sterile nail nipper and filed with dremel without incident  -Answered all patient questions -Patient advised to call the office if any problems or questions arise in the meantime. -Applied salycylic acid treatment to area with dressing. Advised to remove bandaging tomorrow.  -Encouraged daily moisturizing -Discussed use of pumice stone -Advised good supportive shoes and inserts -Patient to return to office as needed or sooner if condition worsens.   Lorenda Peck, DPM

## 2021-01-15 ENCOUNTER — Telehealth: Payer: Self-pay

## 2021-01-15 NOTE — Telephone Encounter (Signed)
Attempted to contact patient to schedule a Palliative Care consult appointment. No answer left a message to return call.  

## 2021-01-15 NOTE — Telephone Encounter (Signed)
Spoke with patient and scheduled an in-person Palliative Consult for 01/25/21 @ 12:30PM.  COVID screening was negative. No pets in home. Patient lives with husband.  Consent obtained; updated Outlook/Netsmart/Team List and Epic.   Patient is aware she may be receiving a call from provider the day before or day of to confirm appointment.

## 2021-01-25 ENCOUNTER — Other Ambulatory Visit: Payer: Self-pay

## 2021-01-25 ENCOUNTER — Other Ambulatory Visit: Payer: BC Managed Care – PPO | Admitting: Hospice

## 2021-01-25 DIAGNOSIS — F339 Major depressive disorder, recurrent, unspecified: Secondary | ICD-10-CM

## 2021-01-25 DIAGNOSIS — Z515 Encounter for palliative care: Secondary | ICD-10-CM

## 2021-01-25 DIAGNOSIS — G232 Striatonigral degeneration: Secondary | ICD-10-CM

## 2021-01-25 DIAGNOSIS — F411 Generalized anxiety disorder: Secondary | ICD-10-CM

## 2021-01-25 NOTE — Progress Notes (Signed)
[facility] Consult Note Telephone: [phone]  Fax: [phone]  PATIENT NAME: Elaine Curry [address] [place] [address] [phone] (home)  DOB: [birth year] MRN: [id number]  PRIMARY CARE PROVIDER:    Samuel Bouche, NP,  [place] [address] [phone]  REFERRING PROVIDER:   Samuel Bouche, [place],  [place] [address] [phone]  RESPONSIBLE PARTY:   Self [name] - spouse is HCPOA Likes to be called Bil. Contact Information     Name Relation Home Work [address]   Barton Fanny Spouse   [phone]   Shelbie Ammons   [phone]        I met face to face with patient and family at home. Palliative Care was asked to follow this patient by consultation request of  Samuel Bouche, NP to address advance care planning, complex medical decision making and goals of care clarification. [name] is home with patient during visit. This is the initial visit.    ASSESSMENT AND / RECOMMENDATIONS:   Advance Care Planning: Our advance care planning conversation included a discussion about:    The value and importance of advance care planning  Difference between Hospice and Palliative care Exploration of goals of care in the event of a sudden injury or illness  Identification and preparation of a healthcare agent  Review and updating or creation of an  advance directive document . Decision not to resuscitate or to de-escalate disease focused treatments due to poor prognosis.  CODE STATUS: Discussion on the ramifications and implication of code status. Patient elects Do Not Resuscitate today. NP signed DNR for patient to keep at home.   Goals of Care: Goals include to maximize quality of life and symptom management. Patient desires hospice service in the future when it is appropriate. Next visit for MOST discussion to further clarify goals of care.   I spent 25  minutes providing this initial consultation. More than 50% of the time in this consultation was spent on counseling patient and coordinating communication. --------------------------------------------------------------------------------------------------------------------------------------  Symptom Management/Plan: Multiple system atrophy, Parkinson variant: Continue Amantadine and Sinemet. Patient is followed by Neurologist at [facility]. Next appointment is Mar 15 2021. Patient is currently on physical therapy. Depression: Zoloft. Counseling services from [facility] work is ongoing.  Anxiety: Managed with Lorazepam. Routine CBC CMP. Low back pain: Managed with Oxycodone; spouse to call for a local pain management clinic as recommended by Sports medicine doctor since lumber epidural did not give relief.   Follow up: Palliative care will continue to follow for complex medical decision making, advance care planning, and clarification of goals. Return 6 weeks or prn.Encouraged to call provider sooner with any concerns.   Family /Caregiver/Community Supports: Patient lives at home with spouse, siblings live close by and come around to help with care and errands. Strong family support system identified  HOSPICE ELIGIBILITY/DIAGNOSIS: TBD  Chief Complaint: Initial Palliative care visit  HISTORY OF PRESENT ILLNESS:  Elaine Curry is a 60 y.o. year old female  with multiple medical conditions including  worsening Multiple system atrophy, Parkinson variant, started in 2019, progressed and worse with more tremors, mobility difficulty; this is worse with stress, impairing her quality of life and ADLs. Patient thinks Sinemet is helpful.  History of Depression, GAD, spondylosis of the lumber region, low back pain.  History obtained from review of EMR, discussion with primary team, caregiver, family and/or Ms. Boerema.  Review and  summarization of Epic records shows history from other  than patient. Rest of 10 point ROS asked and negative.     Review of lab tests/diagnostics   Results for TAKIESHA, MCDEVITT (MRN [id number]) as of 01/25/2021 13:12  Ref. Range 10/11/2020 00:00  Sodium Latest Ref Range: 135 - 146 mmol/L 142  Potassium Latest Ref Range: 3.5 - 5.3 mmol/L 4.4  Chloride Latest Ref Range: 98 - 110 mmol/L 105  CO2 Latest Ref Range: 20 - 32 mmol/L 26  Glucose Latest Ref Range: 65 - 99 mg/dL 105 (H)  BUN Latest Ref Range: 7 - 25 mg/dL 21  Creatinine Latest Ref Range: 0.50 - 1.03 mg/dL 1.40 (H)  Calcium Latest Ref Range: 8.6 - 10.4 mg/dL 9.6  BUN/Creatinine Ratio Latest Ref Range: 6 - 22 (calc) 15  eGFR Latest Ref Range: > OR = 60 mL/min/1.51m 43 (L)  AG Ratio Latest Ref Range: 1.0 - 2.5 (calc) 1.2  AST Latest Ref Range: 10 - 35 U/L 27  ALT Latest Ref Range: 6 - 29 U/L 6  Total Protein Latest Ref Range: 6.1 - 8.1 g/dL 6.4    ROS General: NAD EYES: denies vision changes ENMT: denies dysphagia Cardiovascular: denies chest pain/discomfort Pulmonary: denies cough, denies SOB Abdomen: endorses good appetite, denies constipation/diarrhea GU: denies dysuria, urinary frequency MSK:  endorses weakness, gait imbalance, no falls reported Skin: denies rashes or wounds Neurological: endorses pccasional pain, denies insomnia Psych: Endorses positive mood Heme/lymph/immuno: denies bruises, abnormal bleeding  Physical Exam: Height/Weight: 5 feet 5 inches/115 Ibs down 150 Ibs in the past 2 years and 130 Ibs 3 months ago. 02 98% RA R 18 P71 Constitutional: NAD General: Well groomed, cooperative EYES: anicteric sclera, lids intact, no discharge  ENMT: Moist mucous membrane CV: S1 S2, RRR, no LE edema Pulmonary: LCTA, no increased work of breathing, no cough, Abdomen: active BS + 4 quadrants, soft and non tender GU: no suprapubic tenderness MSK: weakness, sarcopenia, limited ROM, tremors, assistance required to stand/transfer, uses rolling walker for support when she  walks.  Skin: warm and dry, no rashes or wounds on visible skin Neuro:  weakness, otherwise non focal Psych: coping Hem/lymph/immuno: no widespread bruising   PAST MEDICAL HISTORY:  Active Ambulatory Problems    Diagnosis Date Noted   Depression 10/09/2016   Generalized anxiety disorder 10/09/2017   History of basal cell cancer 12/21/2007   Menopausal syndrome 10/05/2019   Insomnia 10/05/2019   Allergy 10/05/2019   Syncope 12/09/2019   Cervical radiculopathy 01/25/2020   Paroxysmal SVT (supraventricular tachycardia) ([facility] 01/25/2020   Stage 3b chronic kidney disease ([facility] 01/25/2020   Chalazion of right lower eyelid 05/03/2020   Impacted cerumen of right ear 05/03/2020   Other spondylosis with radiculopathy, lumbar region 05/03/2020   Visual hallucinations 05/03/2020   Dehydration 05/03/2020   Urinary incontinence 05/03/2020   Weight loss 05/03/2020   Anemia 05/03/2020   Plantar warts 08/04/2020   Clinical trial exam 12/04/2020   Resolved Ambulatory Problems    Diagnosis Date Noted   No Resolved Ambulatory Problems   Past Medical History:  Diagnosis Date   Anxiety    [place]inson's disease ([facility]    Renal insufficiency    Skin cancer     SOCIAL HX:  Social History   Tobacco Use   Smoking status: Never   Smokeless tobacco: Never  Substance Use Topics   Alcohol use: Yes    Comment: Occasionally     FAMILY HX:  Family History  Problem Relation Age of Onset  Lung cancer Mother    Esophageal cancer Father       ALLERGIES: No Known Allergies    PERTINENT MEDICATIONS:  Outpatient Encounter Medications as of 01/25/2021  Medication Sig   amantadine (SYMMETREL) 100 MG capsule Take 100 mg by mouth 3 (three) times daily.   benzonatate (TESSALON) 100 MG capsule Take 1 capsule (100 mg total) by mouth 2 (two) times daily as needed for cough.   buPROPion (WELLBUTRIN XL) 150 MG 24 hr tablet TAKE 1 TABLET BY MOUTH EVERY DAY   carbidopa-levodopa (SINEMET IR) 25-100 MG  tablet Take 1 tablet by mouth 5 (five) times daily.    cephALEXin (KEFLEX) 500 MG capsule Take 1 capsule (500 mg total) by mouth 2 (two) times daily.   ciclopirox (PENLAC) 8 % solution Apply topically at bedtime. Apply over nail and surrounding skin. Apply daily over previous coat. After seven (7) days, may remove with alcohol and continue cycle.   docusate sodium (COLACE) 100 MG capsule Take 1 capsule (100 mg total) by mouth 3 (three) times daily as needed.   gabapentin (NEURONTIN) 300 MG capsule Take 2 capsules (600 mg total) by mouth 2 (two) times daily. (Patient not taking: No sig reported)   guaiFENesin (MUCINEX) 600 MG 12 hr tablet Take 1 tablet (600 mg total) by mouth 2 (two) times daily.   LORazepam (ATIVAN) 0.5 MG tablet Take 1 tablet (0.5 mg total) by mouth 2 (two) times daily as needed for anxiety. Do not use for sleep.  Use sparingly due to high risk of dependency.   naproxen (NAPROSYN) 250 MG tablet Take 1 tablet (250 mg total) by mouth 3 (three) times daily as needed.   oxyCODONE-acetaminophen (PERCOCET) 10-325 MG tablet Take 1 tablet by mouth in the morning, at noon, and at bedtime.   QUEtiapine (SEROQUEL) 25 MG tablet Take 25 mg by mouth at bedtime.   sertraline (ZOLOFT) 50 MG tablet TAKE 2 TABLETS BY MOUTH EVERY DAY   trihexyphenidyl (ARTANE) 2 MG tablet Take 1 tablet by mouth 2 (two) times daily. (Patient not taking: No sig reported)   No facility-administered encounter medications on file as of 01/25/2021.     Thank you for the opportunity to participate in the care of Ms. Skibinski.  The palliative care team will continue to follow. Please call our office at [phone] if we can be of additional assistance.   Note: Portions of this note were generated with L[employer]. Dictation errors may occur despite best attempts at proofreading.  Teodoro Spray, NP

## 2021-01-28 ENCOUNTER — Other Ambulatory Visit: Payer: Self-pay | Admitting: Podiatry

## 2021-01-29 NOTE — Telephone Encounter (Signed)
Please advise 

## 2021-02-05 ENCOUNTER — Other Ambulatory Visit: Payer: Self-pay | Admitting: Medical-Surgical

## 2021-02-06 ENCOUNTER — Other Ambulatory Visit: Payer: Self-pay | Admitting: Medical-Surgical

## 2021-02-13 ENCOUNTER — Ambulatory Visit: Payer: BC Managed Care – PPO | Admitting: Sports Medicine

## 2021-02-20 ENCOUNTER — Other Ambulatory Visit: Payer: Self-pay

## 2021-02-20 ENCOUNTER — Ambulatory Visit (INDEPENDENT_AMBULATORY_CARE_PROVIDER_SITE_OTHER): Payer: Medicare Other | Admitting: Sports Medicine

## 2021-02-20 ENCOUNTER — Telehealth: Payer: Self-pay

## 2021-02-20 ENCOUNTER — Ambulatory Visit (INDEPENDENT_AMBULATORY_CARE_PROVIDER_SITE_OTHER): Payer: BC Managed Care – PPO

## 2021-02-20 DIAGNOSIS — M4726 Other spondylosis with radiculopathy, lumbar region: Secondary | ICD-10-CM

## 2021-02-20 DIAGNOSIS — M5412 Radiculopathy, cervical region: Secondary | ICD-10-CM

## 2021-02-20 DIAGNOSIS — G20 Parkinson's disease: Secondary | ICD-10-CM | POA: Diagnosis not present

## 2021-02-20 MED ORDER — MORPHINE SULFATE ER 15 MG PO TBCR: 15.0000 mg | EXTENDED_RELEASE_TABLET | Freq: Two times a day (BID) | ORAL | 0 refills | Status: AC

## 2021-02-20 NOTE — Telephone Encounter (Signed)
Medication: morphine (MS CONTIN) 15 MG 12 hr tablet Prior authorization determination received Medication has been approved Approval dates: 02/20/2021-02/20/2022  Patient aware via: [place] aware: Yes Provider aware via this encounter

## 2021-02-20 NOTE — Assessment & Plan Note (Addendum)
Elaine Curry returns, she is a pleasant 60 year old female, end-stage Parkinson's disease, currently getting some hospice care. Today we did trigger point injections into the paracervical musculature, this seemed to work back in August. I am also going to work on controlling her chronic pain with medications while she waits to get in with the pain clinic. Currently doing up to 30 total milligrams of oxycodone per day, this is 45 mg morphine equivalent so we will switch to 15 mg morphine sulfate extended release twice daily with MiraLAX and she can do the oxycodone as needed for breakthrough pain. She is interested in seeking chiropractic evaluation, I think this is acceptable. I would also like cervical spine x-rays and an MRI for epidural planning. We can titrate her dose every 2 weeks.

## 2021-02-20 NOTE — Progress Notes (Signed)
    Procedures performed today:    Procedure:  Injection of #4 paracervical trigger point injections Consent obtained and verified. Time-out conducted. Noted no overlying erythema, induration, or other signs of local infection. Skin prepped in a sterile fashion. Topical analgesic spray: Ethyl chloride. Completed without difficulty. Meds: Total of 1 cc kenalog 40, 4 cc lidocaine spread out between the trigger points. Advised to call if fevers/chills, erythema, induration, drainage, or persistent bleeding.  Independent interpretation of notes and tests performed by another provider:   None.  Brief History, Exam, Impression, and Recommendations:    Cervical radiculopathy Elaine Curry returns, she is a pleasant 60 year old female, end-stage Parkinson's disease, currently getting some hospice care. Today we did trigger point injections into the paracervical musculature, this seemed to work back in August. I am also going to work on controlling Curry chronic pain with medications while she waits to get in with the pain clinic. Currently doing up to 30 total milligrams of oxycodone per day, this is 45 mg morphine equivalent so we will switch to 15 mg morphine sulfate extended release twice daily with MiraLAX and she can do the oxycodone as needed for breakthrough pain. She is interested in seeking chiropractic evaluation, I think this is acceptable. I would also like cervical spine x-rays and an MRI for epidural planning. We can titrate Curry dose every 2 weeks.  Other spondylosis with radiculopathy, lumbar region As above this pleasant 60 year old female has multiple sclerosis/Parkinson's disease, end-stage with some hospice treatment. She had a lumbar epidural with no relief, not even temporary, MRI did show degenerative disc disease without any obvious neuroforaminal stenosis. Predominant complaint right now is debilitating lumbar pain interfering with Curry movement. We added oxycodone 10 mg 3  times daily, she feels as though this dose has not been sedating, though it was somewhat constipating until she got more consistent with Curry MiraLAX. As she has had difficulty being consistent with dosing I will convert Curry 30 total daily milligrams of oxycodone to the morphine sulfate equivalent which is 45 mg. Thus we will use MS Contin 15 mg p.o. twice daily with oxycodone for use up to 3 times daily for breakthrough pain. She is also working to get in with the pain clinic, and as above does have some hospice care at home.  I spent 30 minutes of total time managing this patient today, this includes chart review, face to face, and non-face to face time.  ___________________________________________ Elaine Curry. Elaine Curry, M.D., ABFM., CAQSM. Primary Care and [place] Instructor of [place] [facility] of Medicine

## 2021-02-20 NOTE — Telephone Encounter (Signed)
Medication: morphine (MS CONTIN) 15 MG 12 hr tablet Prior authorization submitted via CoverMyMeds on 02/20/2021 PA submission pending

## 2021-02-20 NOTE — Assessment & Plan Note (Signed)
As above this pleasant 60 year old female has multiple sclerosis/Parkinson's disease, end-stage with some hospice treatment. She had a lumbar epidural with no relief, not even temporary, MRI did show degenerative disc disease without any obvious neuroforaminal stenosis. Predominant complaint right now is debilitating lumbar pain interfering with her movement. We added oxycodone 10 mg 3 times daily, she feels as though this dose has not been sedating, though it was somewhat constipating until she got more consistent with her MiraLAX. As she has had difficulty being consistent with dosing I will convert her 30 total daily milligrams of oxycodone to the morphine sulfate equivalent which is 45 mg. Thus we will use MS Contin 15 mg p.o. twice daily with oxycodone for use up to 3 times daily for breakthrough pain. She is also working to get in with the pain clinic, and as above does have some hospice care at home.

## 2021-02-25 ENCOUNTER — Other Ambulatory Visit: Payer: BC Managed Care – PPO

## 2021-03-01 ENCOUNTER — Telehealth: Payer: Self-pay

## 2021-03-01 NOTE — Telephone Encounter (Signed)
Pt's husband called and stated that Elaine Curry has a stye on her left eye. Pt's husband asked for something to be sent in to treat it. Please send to [facility]. Thanks

## 2021-03-01 NOTE — Telephone Encounter (Signed)
Recommend warm compresses several times per day.  Follow up if not improving.

## 2021-03-02 NOTE — Telephone Encounter (Signed)
Pt made aware of recommendations. 

## 2021-03-06 ENCOUNTER — Telehealth (INDEPENDENT_AMBULATORY_CARE_PROVIDER_SITE_OTHER): Payer: BC Managed Care – PPO | Admitting: Sports Medicine

## 2021-03-06 DIAGNOSIS — M4726 Other spondylosis with radiculopathy, lumbar region: Secondary | ICD-10-CM | POA: Diagnosis not present

## 2021-03-06 DIAGNOSIS — M5412 Radiculopathy, cervical region: Secondary | ICD-10-CM | POA: Diagnosis not present

## 2021-03-06 NOTE — Progress Notes (Signed)
° °  Virtual Visit via Telephone   I connected with  Elaine Curry  on 03/06/21 by telephone/telehealth and verified that I am speaking with the correct person using two identifiers.   I discussed the limitations, risks, security and privacy concerns of performing an evaluation and management service by telephone, including the higher likelihood of inaccurate diagnosis and treatment, and the availability of in person appointments.  We also discussed the likely need of an additional face to face encounter for complete and high quality delivery of care.  I also discussed with the patient that there may be a patient responsible charge related to this service. The patient expressed understanding and wishes to proceed.  Provider location is in medical facility. Patient location is at their home, different from provider location. People involved in care of the patient during this telehealth encounter were myself, my nurse/medical assistant, and my front office/scheduling team member.  Review of Systems: No fevers, chills, night sweats, weight loss, chest pain, or shortness of breath.   Objective Findings:    General: Speaking full sentences, no audible heavy breathing.  Sounds alert and appropriately interactive.    Independent interpretation of tests performed by another provider:   None.  Brief History, Exam, Impression, and Recommendations:    Other spondylosis with radiculopathy, lumbar region This is a pleasant 60 year old female, she has multiple sclerosis/Parkinson's disease, end-stage with hospice treatment. She had a lumbar epidural without any improvement, not even temporary. Principal complaint was debilitating lumbar pain interfering with her movement, oxycodone seems to help but she was inconsistent with dosing and did get some constipation. We converted this to MS Contin, 15 mg MS Contin twice daily seems to have controlled her pain well without even having to use oxycodone, she  reports no back pain, she is functional, she is able to walk. I can refill this as she needs. No change in plan.  Cervical radiculopathy As below 60 year old female, end-stage Parkinson's/MS, getting hospice care, we did some trigger point injections at the last visit, we also started MS Contin 15 twice daily for chronic pain management for her end-stage disease. Her pain is gone with the trigger point injections and the MS Contin, MiraLAX controls constipation, and she has not had to use any oxycodone, she is going to be seeing a chiropractor soon, imaging is coming up in the form of an MRI, though I do not think we will need to proceed with any intervention. Return as needed.   I discussed the above assessment and treatment plan with the patient. The patient was provided an opportunity to ask questions and all were answered. The patient agreed with the plan and demonstrated an understanding of the instructions.   The patient was advised to call back or seek an in-person evaluation if the symptoms worsen or if the condition fails to improve as anticipated.   I provided 30 minutes of verbal and non-verbal time during this encounter date, time was needed to gather information, review chart, records, communicate/coordinate with staff remotely, as well as complete documentation.   ___________________________________________ [name]. Dianah Field, M.D., ABFM., CAQSM. Primary Care and Sports Medicine [facility]  Adjunct Professor of [employer] [facility] of Medicine

## 2021-03-06 NOTE — Assessment & Plan Note (Signed)
As below 60 year old female, end-stage Parkinson's/MS, getting hospice care, we did some trigger point injections at the last visit, we also started MS Contin 15 twice daily for chronic pain management for her end-stage disease. Her pain is gone with the trigger point injections and the MS Contin, MiraLAX controls constipation, and she has not had to use any oxycodone, she is going to be seeing a chiropractor soon, imaging is coming up in the form of an MRI, though I do not think we will need to proceed with any intervention. Return as needed.

## 2021-03-06 NOTE — Assessment & Plan Note (Signed)
This is a pleasant 60 year old female, she has multiple sclerosis/Parkinson's disease, end-stage with hospice treatment. She had a lumbar epidural without any improvement, not even temporary. Principal complaint was debilitating lumbar pain interfering with her movement, oxycodone seems to help but she was inconsistent with dosing and did get some constipation. We converted this to MS Contin, 15 mg MS Contin twice daily seems to have controlled her pain well without even having to use oxycodone, she reports no back pain, she is functional, she is able to walk. I can refill this as she needs. No change in plan.

## 2021-03-06 NOTE — Progress Notes (Signed)
Patient would like a phone call vs. Video visit [phone]  She states her back and back feel better and has not had to take the Oxycodone. She is still a little constipated but taking Miralax to help with this.

## 2021-03-08 ENCOUNTER — Other Ambulatory Visit: Payer: Self-pay

## 2021-03-08 ENCOUNTER — Other Ambulatory Visit: Payer: BC Managed Care – PPO | Admitting: Hospice

## 2021-03-19 ENCOUNTER — Other Ambulatory Visit: Payer: Self-pay | Admitting: Family Medicine

## 2021-03-19 ENCOUNTER — Other Ambulatory Visit: Payer: Self-pay | Admitting: Medical-Surgical

## 2021-03-19 ENCOUNTER — Other Ambulatory Visit: Payer: Self-pay | Admitting: Sports Medicine

## 2021-03-19 DIAGNOSIS — M545 Low back pain, unspecified: Secondary | ICD-10-CM

## 2021-03-19 DIAGNOSIS — U071 COVID-19: Secondary | ICD-10-CM

## 2021-03-19 DIAGNOSIS — M5412 Radiculopathy, cervical region: Secondary | ICD-10-CM

## 2021-03-20 ENCOUNTER — Other Ambulatory Visit: Payer: Self-pay | Admitting: Medical-Surgical

## 2021-03-22 ENCOUNTER — Other Ambulatory Visit: Payer: Self-pay | Admitting: Medical-Surgical

## 2021-03-26 ENCOUNTER — Other Ambulatory Visit: Payer: BC Managed Care – PPO | Admitting: Hospice

## 2021-03-26 ENCOUNTER — Other Ambulatory Visit: Payer: Self-pay

## 2021-03-26 DIAGNOSIS — Z515 Encounter for palliative care: Secondary | ICD-10-CM

## 2021-03-26 DIAGNOSIS — F411 Generalized anxiety disorder: Secondary | ICD-10-CM

## 2021-03-26 DIAGNOSIS — F339 Major depressive disorder, recurrent, unspecified: Secondary | ICD-10-CM

## 2021-03-26 DIAGNOSIS — G232 Striatonigral degeneration: Secondary | ICD-10-CM

## 2021-03-26 NOTE — Progress Notes (Signed)
[facility] Consult Note Telephone: [phone]  Fax: [phone]  PATIENT NAME: Elaine Curry [address] [place] [address] [phone] (home)  DOB: 1960/08/14 MRN: [id number]  PRIMARY CARE PROVIDER:    Samuel Bouche, NP,  [place] [address] [phone]  REFERRING PROVIDER:   Samuel Bouche, [address] [place] [place],  [place] [address] [phone]  RESPONSIBLE PARTY:   Self [name] - spouse is HCPOA Likes to be called Bil. Contact Information     Name Relation Home Work [address]   Barton Fanny Spouse   [phone]   Shelbie Ammons   [phone]        I met face to face with patient and family at home. Palliative Care was asked to follow this patient by consultation request of  Samuel Bouche, NP to address advance care planning, complex medical decision making and goals of care clarification. [name] is home with patient during visit. This is a follow up visit.    ASSESSMENT AND / RECOMMENDATIONS:   CODE STATUS: Patient is a Do Not Resuscitate.  Goals of Care: Goals include to maximize quality of life and symptom management. Patient desires hospice service in the future when it is appropriate. Next visit for MOST discussion to further clarify goals of care. MOST form given to patient in preparation.   Spiritual care is ongoing ;  patient was baptized 2 weeks ago at [employer].  Symptom Management/Plan: Multiple system atrophy, Parkinson variant: At baseline. Continue Amantadine and Sinemet. Patient is [facility] by [facility]rologist at [facility]. Patient is currently on physical therapy. Constipation: Managed with Colace and Miralax. Depression: Zoloft. Counseling services from [employer] work is ongoing.  Anxiety: Managed with Lorazepam prn. Last taken last night, effective Low back pain: Managed with Oxycodone; effective.   Follow up:  Palliative care will continue to follow for complex medical decision making, advance care planning, and clarification of goals. Return 6 weeks or prn.Encouraged to call provider sooner with any concerns.   Family /Caregiver/Community Supports: Patient lives at home with spouse, siblings live close by and come around to help with care and errands. Strong family support system identified  HOSPICE ELIGIBILITY/DIAGNOSIS: TBD  Chief Complaint: Follow up visit  HISTORY OF PRESENT ILLNESS:  Elaine Curry is a 61 y.o. year old female  with multiple medical conditions including Multiple system atrophy, Parkinson variant, started in 2019. History of Depression, GAD, spondylosis of the lumber region, low back pain. Patient reports her pain, constipation are well managed, occasional tremors persist; She continues with Sinemet and followed by Neurologist.  History obtained from review of EMR, discussion with primary team, caregiver, family and/or Ms. Carreto.  Review and summarization of Epic records shows history from other than patient. Rest of 10 point ROS asked and negative.    ROS General: NAD EYES: denies vision changes ENMT: denies dysphagia Cardiovascular: denies chest pain/discomfort Pulmonary: denies cough, denies SOB Abdomen: endorses good appetite, denies constipation/diarrhea GU: denies dysuria, urinary frequency MSK:  endorses weakness, gait imbalance, no falls reported Skin: denies rashes or wounds Neurological: endorses pccasional pain, denies insomnia Psych: Endorses occasional anxiety Heme/lymph/immuno: denies bruises, abnormal bleeding  Physical Exam: Height/Weight: 5 feet 5 inches/115 Ibs down 150 Ibs in the past 2 years and 130 Ibs 4 months ago. Constitutional: NAD General: Well groomed, cooperative EYES: anicteric sclera, lids intact, no discharge  ENMT: Moist mucous membrane CV: S1 S2, RRR, no LE  edema Pulmonary: LCTA, no increased work of breathing, no cough, Abdomen:  active BS + 4 quadrants, soft and non tender GU: no suprapubic tenderness MSK: weakness, sarcopenia, limited ROM, tremors, assistance required to stand/transfer, uses rolling walker for support when she walks.  Skin: warm and dry, no rashes or wounds on visible skin Neuro:  weakness, otherwise non focal Psych: coping Hem/lymph/immuno: no widespread bruising   PAST MEDICAL HISTORY:  Active Ambulatory Problems    Diagnosis Date Noted   Depression 10/09/2016   Generalized anxiety disorder 10/09/2017   History of basal cell cancer 12/21/2007   Menopausal syndrome 10/05/2019   Insomnia 10/05/2019   Allergy 10/05/2019   Syncope 12/09/2019   Cervical radiculopathy 01/25/2020   Paroxysmal SVT (supraventricular tachycardia) ([place]) 01/25/2020   Stage 3b chronic kidney disease ([place]) 01/25/2020   Chalazion of right lower eyelid 05/03/2020   Impacted cerumen of right ear 05/03/2020   Other spondylosis with radiculopathy, lumbar region 05/03/2020   Visual hallucinations 05/03/2020   Dehydration 05/03/2020   Urinary incontinence 05/03/2020   Weight loss 05/03/2020   Anemia 05/03/2020   Plantar warts 08/04/2020   Clinical trial exam 12/04/2020   Resolved Ambulatory Problems    Diagnosis Date Noted   No Resolved Ambulatory Problems   Past Medical History:  Diagnosis Date   Anxiety    Parkinson's disease ([place])    Renal insufficiency    Skin cancer     SOCIAL HX:  Social History   Tobacco Use   Smoking status: Never   Smokeless tobacco: Never  Substance Use Topics   Alcohol use: Yes    Comment: Occasionally     FAMILY HX:  Family History  Problem Relation Age of Onset   Lung cancer Mother    Esophageal cancer Father       ALLERGIES: No Known Allergies    PERTINENT MEDICATIONS:  Outpatient Encounter Medications as of 03/26/2021  Medication Sig   amantadine (SYMMETREL) 100 MG capsule Take 100 mg by mouth 3 (three) times daily.   benzonatate (TESSALON) 100 MG capsule Take  1 capsule (100 mg total) by mouth 2 (two) times daily as needed for cough.   buPROPion (WELLBUTRIN XL) 150 MG 24 hr tablet TAKE 1 TABLET BY MOUTH EVERY DAY   carbidopa-levodopa (SINEMET IR) 25-100 MG tablet Take 1 tablet by mouth 5 (five) times daily.    cephALEXin (KEFLEX) 500 MG capsule Take 1 capsule (500 mg total) by mouth 2 (two) times daily.   ciclopirox (PENLAC) 8 % solution APPLY TOPICALLY AT BEDTIME. APPLY OVER NAIL AND SURROUNDING SKIN. APPLY DAILY OVER PREVIOUS COAT. AFTER SEVEN (7) DAYS, MAY REMOVE WITH ALCOHOL AND CONTINUE CYCLE.   docusate sodium (COLACE) 100 MG capsule Take 1 capsule (100 mg total) by mouth 3 (three) times daily as needed.   gabapentin (NEURONTIN) 300 MG capsule Take 2 capsules (600 mg total) by mouth 2 (two) times daily.   guaiFENesin (MUCINEX) 600 MG 12 hr tablet Take 1 tablet (600 mg total) by mouth 2 (two) times daily.   LORazepam (ATIVAN) 0.5 MG tablet TAKE 1 TABLET (0.5 MG TOTAL) BY MOUTH 2 (TWO) TIMES DAILY AS NEEDED FOR ANXIETY. DO NOT USE FOR SLEEP. USE SPARINGLY DUE TO HIGH RISK OF DEPENDENCY.   morphine (MS CONTIN) 15 MG 12 hr tablet Take 1 tablet (15 mg total) by mouth every 12 (twelve) hours.   naproxen (NAPROSYN) 250 MG tablet TAKE 1 TABLET BY MOUTH THREE TIMES DAILY AS NEEDED   oxyCODONE-acetaminophen (PERCOCET) 10-325 MG  tablet Take 1 tablet by mouth in the morning, at noon, and at bedtime.   QUEtiapine (SEROQUEL) 25 MG tablet Take 25 mg by mouth at bedtime.   sertraline (ZOLOFT) 50 MG tablet TAKE 2 TABLETS BY MOUTH EVERY DAY   trihexyphenidyl (ARTANE) 2 MG tablet Take 1 tablet by mouth 2 (two) times daily.   No facility-administered encounter medications on file as of 03/26/2021.     Thank you for the opportunity to participate in the care of Ms. Sellin.  The palliative care team will continue to follow. Please call our office at [phone] if we can be of additional assistance.   Note: Portions of this note were generated with Theatre manager. Dictation errors may occur despite best attempts at proofreading.  Teodoro Spray, NP

## 2021-04-13 ENCOUNTER — Other Ambulatory Visit: Payer: Self-pay

## 2021-04-13 ENCOUNTER — Encounter: Payer: Self-pay | Admitting: Medical-Surgical

## 2021-04-13 ENCOUNTER — Ambulatory Visit (INDEPENDENT_AMBULATORY_CARE_PROVIDER_SITE_OTHER): Payer: BC Managed Care – PPO | Admitting: Medical-Surgical

## 2021-04-13 VITALS — BP 123/79 | HR 84 | Resp 20 | Ht 66.0 in | Wt 115.0 lb

## 2021-04-13 DIAGNOSIS — Z515 Encounter for palliative care: Secondary | ICD-10-CM | POA: Diagnosis not present

## 2021-04-13 DIAGNOSIS — F411 Generalized anxiety disorder: Secondary | ICD-10-CM

## 2021-04-13 DIAGNOSIS — F4320 Adjustment disorder, unspecified: Secondary | ICD-10-CM | POA: Diagnosis not present

## 2021-04-13 DIAGNOSIS — R32 Unspecified urinary incontinence: Secondary | ICD-10-CM

## 2021-04-13 DIAGNOSIS — F324 Major depressive disorder, single episode, in partial remission: Secondary | ICD-10-CM | POA: Diagnosis not present

## 2021-04-13 DIAGNOSIS — L723 Sebaceous cyst: Secondary | ICD-10-CM

## 2021-04-13 MED ORDER — LORAZEPAM 1 MG PO TABS: 0.5000 mg | ORAL_TABLET | Freq: Two times a day (BID) | ORAL | 2 refills | Status: DC | PRN

## 2021-04-13 NOTE — Progress Notes (Signed)
HPI with pertinent ROS:   CC: Chronic disease follow-up  HPI: Very pleasant 61 year old female accompanied by her husband presenting today for follow-up on chronic diseases.  Since her last visit here, she has been followed by palliative care and they are working on getting their affairs in order.  She has been slowly but steadily worsening due to multiple system atrophy.  She is having difficulty with chronic pain and has an upcoming appointment with pain management.  Currently using Percocet 3 times daily.  She does note that there is morphine available and it helps with her pain quite a bit but her husband is very worried that she will urinate while sleeping and the wall interfere with her nightly routine.  She continues to take her medication regularly and is doing well on most of them.  She still takes lorazepam regularly to help manage the severe anxiety and panic attacks she is having due to her rapidly declining status and anticipatory grief.   Has a sore on her back that started as a "knot". Her sister found it when helping her wash. The knot was not painful or itchy. Patient reports not knowing it was there. The knot gradually got bigger then ruptured, starting to drain a little. They have been putting Mercurochrome on it and keeping it covered with a bandaid but it has not gotten better or gone away. At night, when she is in bed and very restless, notes that it gets irritated by rubbing on the sheets.    I reviewed the past medical history, family history, social history, surgical history, and allergies today and no changes were needed.  Please see the problem list section below in epic for further details.   Physical exam:   Physical Exam Constitutional:      General: She is not in acute distress.    Appearance: Normal appearance. She is ill-appearing. She is not toxic-appearing or diaphoretic.  HENT:     Head: Normocephalic and atraumatic.  Eyes:     General: No scleral icterus.        Right eye: No discharge.        Left eye: No discharge.     Extraocular Movements: Extraocular movements intact.     Conjunctiva/sclera: Conjunctivae normal.     Pupils: Pupils are equal, round, and reactive to light.  Cardiovascular:     Rate and Rhythm: Normal rate and regular rhythm.     Pulses: Normal pulses.     Heart sounds: Normal heart sounds. No murmur heard.   No friction rub. No gallop.  Pulmonary:     Effort: Pulmonary effort is normal. No respiratory distress.     Breath sounds: Normal breath sounds.  Skin:    General: Skin is warm and dry.     Findings: Wound present.       Neurological:     Mental Status: She is alert and oriented to person, place, and time.     Motor: Atrophy present.     Comments: Wheelchair bound. Limited ROM and mobility to all extremities.   Psychiatric:        Attention and Perception: Attention normal.        Mood and Affect: Mood is anxious and depressed. Affect is tearful.        Behavior: Behavior normal.        Thought Content: Thought content normal.        Judgment: Judgment normal.     Comments: Hesitant speech with difficulty getting  words out at times.    Impression and Recommendations:    1. Palliative care patient Not ready for transition to Hospice at this point. They are being followed closely by Palliative and will see them again in 2 months.   2. Major depressive disorder with single episode, in partial remission ([place]) 3. Generalized anxiety disorder 4. Anticipatory grief Continue Wellbutrin and sertraline. Continue Seroquel at night. Increasing Ativan to 0.5-1mg  twice daily as needed to manage panic attacks. Recommend counseling for both [place] and her husband Rush [place]mark. Advised reaching out to Palliative with concerns regarding progression and home follow up frequency. Discussed measures to help with anticipatory grief. Would like to see them maximize the time that is left by making the moments count even in small ways.    5. Sebaceous cyst Feel that this knot on her back is a sebaceous cysts that ruptured. It is currently asymptomatic so she wants to just monitor for now and will return if she wants to have it removed. In the meantime, keep covered to prevent further damage to the skin. Xeroform placed to the open area, covered with Allevyn for cushioning and secured in place with tegaderm. Recommend changing it every 24-36 hours, washing with mild soapy water before applying a new dressing. Sent leftover dressing supplies home with patient. No current signs of infection so holding off on antibiotics.   6. Urinary incontinence, unspecified type She is doing fairly well at this point but there is concern regarding the use of Morphine and nighttime incontinence. Discussed using bed pads, mattress protectors, and briefs to prevent soiling bedclothes or having to clean a mess.   Return in about 3 months (around 07/12/2021) for chronic disease follow up. ___________________________________________ Clearnce Sorrel, DNP, APRN, FNP-BC Primary Care and [place]

## 2021-04-26 ENCOUNTER — Encounter: Payer: Self-pay | Admitting: Medical-Surgical

## 2021-05-01 ENCOUNTER — Telehealth: Payer: Self-pay | Admitting: Medical-Surgical

## 2021-05-01 NOTE — Telephone Encounter (Signed)
Pt's husband called and said the boil on her back is oozing pus and blood. He said the patches that they put on it does not prevent it from getting on her sheets. They want to schedule an appt but your next available isn't until March. Should I use a same day visit on Wednesday for this appt?

## 2021-05-02 NOTE — Telephone Encounter (Signed)
Appt scheduled for Thursday at 11am

## 2021-05-03 ENCOUNTER — Encounter: Payer: Self-pay | Admitting: Medical-Surgical

## 2021-05-03 ENCOUNTER — Ambulatory Visit (INDEPENDENT_AMBULATORY_CARE_PROVIDER_SITE_OTHER): Payer: Medicare Other | Admitting: Medical-Surgical

## 2021-05-03 ENCOUNTER — Other Ambulatory Visit: Payer: Self-pay

## 2021-05-03 VITALS — BP 88/56 | HR 101 | Resp 20 | Ht 66.0 in | Wt 115.0 lb

## 2021-05-03 DIAGNOSIS — L989 Disorder of the skin and subcutaneous tissue, unspecified: Secondary | ICD-10-CM

## 2021-05-03 NOTE — Progress Notes (Signed)
°  HPI with pertinent ROS:   CC: Lesion on back  HPI: Pleasant 61 year old female presenting today accompanied by her brother to evaluate the skin lesion on her back.  This lesion has been there for several months.  It was found incidentally when family members were helping her shower.  Since then, they have been working to keep it covered and to treat it appropriately however it continues to be irritated and has been draining as well as oozing blood.  The lesion is not painful.  Instead, she is frustrated with it draining onto her clothing and sheets.  Her brother feels as if there is a "sac" in there that needs to be removed so that it will go away.  The area does receive quite a bit of pressure with her level of immobility, thin frame, and comfortable positioning.  I reviewed the past medical history, family history, social history, surgical history, and allergies today and no changes were needed.  Please see the problem list section below in epic for further details.   Physical exam:   General: Well Developed, ill-appearing, poorly nourished, and in no acute distress.  Neuro: Alert and oriented x3.  HEENT: Normocephalic, atraumatic.  Skin: Warm and dry.  Approximately 3 cm x 4 cm ovoid lesion, raised approximately 0.5 cm, denuded top surface with slow ooze of blood and serous fluid.  Bandage removed and no evidence of purulent drainage. Cardiac: Regular rate and rhythm.  Respiratory: Not using accessory muscles, speaking in full sentences.  Impression and Recommendations:    1. Skin lesion of back Lesion originally evaluated at her last appointment however it was felt at that time that this was more of a sebaceous cyst.  Consulted with Dr. Zigmund Daniel, supervising provider for a second opinion.  With her current presentation and the lack of any improvement in the lesion despite keeping it covered, there is some concern for basal cell carcinoma versus result from chronic pressure to the area.   Ultimately would like to get a biopsy however this is a significantly vascular lesion.  Advised patient that we would like to get her on Dr. Mcneil Sober schedule for a biopsy so that we know what we are addressing and can tailor treatment appropriately.  Return if symptoms worsen or fail to improve. ___________________________________________ Clearnce Sorrel, DNP, APRN, FNP-BC Primary Care and [place]

## 2021-05-07 ENCOUNTER — Other Ambulatory Visit: Payer: Medicare Other | Admitting: Hospice

## 2021-05-07 ENCOUNTER — Other Ambulatory Visit: Payer: Self-pay

## 2021-05-07 DIAGNOSIS — G232 Striatonigral degeneration: Secondary | ICD-10-CM

## 2021-05-07 DIAGNOSIS — Z515 Encounter for palliative care: Secondary | ICD-10-CM

## 2021-05-07 DIAGNOSIS — K5901 Slow transit constipation: Secondary | ICD-10-CM

## 2021-05-07 DIAGNOSIS — M545 Low back pain, unspecified: Secondary | ICD-10-CM

## 2021-05-07 DIAGNOSIS — G4700 Insomnia, unspecified: Secondary | ICD-10-CM

## 2021-05-07 DIAGNOSIS — G8929 Other chronic pain: Secondary | ICD-10-CM

## 2021-05-07 NOTE — Progress Notes (Signed)
[facility] Consult Note Telephone: [phone]  Fax: [phone]  PATIENT NAME: Elaine Curry [address] [place] [address] [phone] (home)  DOB: [birth year] MRN: [id number]  PRIMARY CARE PROVIDER:    Samuel Bouche, NP,  Elaine Curry [id number] [phone]  REFERRING PROVIDER:   Samuel Curry, [place],  [place] [address] [phone]  RESPONSIBLE PARTY:   Self Elaine Curry - spouse is HCPOA Likes to be called Bil. Contact Information     Name Relation Home Work [address]   Barton Fanny Spouse   [phone]   Shelbie Ammons   [phone]        I met face to face with patient and family at home. Palliative Care was asked to follow this patient by consultation request of  Elaine Bouche, NP to address advance care planning, complex medical decision making and goals of care clarification. Elaine Curry is home with patient during visit. This is a follow up visit.    ASSESSMENT AND / RECOMMENDATIONS:   CODE STATUS: Patient is a Do Not Resuscitate.  Goals of Care: Goals include to maximize quality of life and symptom management.  Extensive discussion today on de-escalation of care and goals of care clarification due to patient's worsening disease progression.  Most selections today include DO NOT RESUSCITATE, comfort measures, IV fluids for defined trial.,  Antibiotics if indicated, no feeding tube. Patient leaning towards hospice care; to be determined next visit. Questions on eligibility for hospice care and services provided answered to patient's satisfaction. Spiritual care is ongoing ;  patient was recently baptized .  Symptom Management/Plan: Multiple system atrophy, Parkinson variant: worsening , weight loss, significant decline in mobility; recently completed physical therapy with no improvement in gait/mobility.  Followed by neurologist at [facility], appointment is early next month.  Continue Amantadine and Sinemet as ordered.  Constipation: Managed with Colace and Miralax. Depression/anxiety: Zoloft. Counseling services from [employer] work is ongoing.  Continue lorazepam as ordered prn. Low back pain: Managed with Oxycodone; effective. [facility] manages her pain. Next appointment 06/12/21.  Insomnia: Managed with Melatonin 5 mg at bedtime.   Follow up: Palliative care will continue to follow for complex medical decision making, advance care planning, and clarification of goals. Return 6 weeks or prn.Encouraged to call provider sooner with any concerns.   Family /Caregiver/Community Supports: Patient lives at home with spouse, siblings live close by and come around to help with care and errands. Strong family support system identified  HOSPICE ELIGIBILITY/DIAGNOSIS: TBD  Chief Complaint: Follow up visit  HISTORY OF PRESENT ILLNESS:  Elaine Curry is a 61 y.o. year old female  with multiple medical conditions including Multiple system atrophy, Parkinson variant, started in 2019, worsening now can only take a few steps with max assist; max assist in ADLs, changes in vocalization, ongoing weight loss.  Ongoing weight loss despite normal oral intake. Height/Weight: 5 feet 5 inches/115 Ibs down 150 Ibs in the past 2 years and 130 Ibs 5 months ago. History of Depression, GAD, spondylosis of the lumber region, low back pain, constipation.  Patient has a good understanding of her disease process; wishes for hospice services at home when she comes to the decision.  She denies pain/discomfort during visit, endorses worsening muscular weakness, and insomnia. History obtained from review of EMR, discussion with primary team, caregiver, family and/or Elaine Curry.  Review and summarization of Epic records shows  history from other than patient. Rest of 10 point ROS asked and negative.    PAST MEDICAL HISTORY:  Active Ambulatory  Problems    Diagnosis Date Noted   Depression 10/09/2016   Generalized anxiety disorder 10/09/2017   History of basal cell cancer 12/21/2007   Menopausal syndrome 10/05/2019   Insomnia 10/05/2019   Allergy 10/05/2019   Syncope 12/09/2019   Cervical radiculopathy 01/25/2020   Paroxysmal SVT (supraventricular tachycardia) ([place]) 01/25/2020   Stage 3b chronic kidney disease ([place]) 01/25/2020   Chalazion of right lower eyelid 05/03/2020   Impacted cerumen of right ear 05/03/2020   Other spondylosis with radiculopathy, lumbar region 05/03/2020   Visual hallucinations 05/03/2020   Dehydration 05/03/2020   Urinary incontinence 05/03/2020   Weight loss 05/03/2020   Anemia 05/03/2020   Plantar warts 08/04/2020   Clinical trial exam 12/04/2020   Resolved Ambulatory Problems    Diagnosis Date Noted   No Resolved Ambulatory Problems   Past Medical History:  Diagnosis Date   Anxiety    Parkinson's disease ([place])    Renal insufficiency    Skin cancer     SOCIAL HX:  Social History   Tobacco Use   Smoking status: Never   Smokeless tobacco: Never  Substance Use Topics   Alcohol use: Yes    Comment: Occasionally     FAMILY HX:  Family History  Problem Relation Age of Onset   Lung cancer Mother    Esophageal cancer Father       ALLERGIES: No Known Allergies    PERTINENT MEDICATIONS:  Outpatient Encounter Medications as of 05/07/2021  Medication Sig   amantadine (SYMMETREL) 100 MG capsule Take 100 mg by mouth 3 (three) times daily.   benzonatate (TESSALON) 100 MG capsule Take 1 capsule (100 mg total) by mouth 2 (two) times daily as needed for cough.   buPROPion (WELLBUTRIN XL) 150 MG 24 hr tablet TAKE 1 TABLET BY MOUTH EVERY DAY   carbidopa-levodopa (SINEMET IR) 25-100 MG tablet Take 1 tablet by mouth 5 (five) times daily.    cephALEXin (KEFLEX) 500 MG capsule Take 1 capsule (500 mg total) by mouth 2 (two) times daily.   ciclopirox (PENLAC) 8 % solution APPLY TOPICALLY AT  BEDTIME. APPLY OVER NAIL AND SURROUNDING SKIN. APPLY DAILY OVER PREVIOUS COAT. AFTER SEVEN (7) DAYS, MAY REMOVE WITH ALCOHOL AND CONTINUE CYCLE.   docusate sodium (COLACE) 100 MG capsule Take 1 capsule (100 mg total) by mouth 3 (three) times daily as needed.   gabapentin (NEURONTIN) 300 MG capsule Take 2 capsules (600 mg total) by mouth 2 (two) times daily.   guaiFENesin (MUCINEX) 600 MG 12 hr tablet Take 1 tablet (600 mg total) by mouth 2 (two) times daily.   LORazepam (ATIVAN) 1 MG tablet Take 0.5-1 tablets (0.5-1 mg total) by mouth 2 (two) times daily as needed for anxiety. NOTE DOSE CHANGE   morphine (MS CONTIN) 15 MG 12 hr tablet Take 1 tablet (15 mg total) by mouth every 12 (twelve) hours.   naproxen (NAPROSYN) 250 MG tablet TAKE 1 TABLET BY MOUTH THREE TIMES DAILY AS NEEDED   oxyCODONE-acetaminophen (PERCOCET) 10-325 MG tablet Take 1 tablet by mouth in the morning, at noon, and at bedtime.   QUEtiapine (SEROQUEL) 25 MG tablet Take 25 mg by mouth at bedtime.   sertraline (ZOLOFT) 50 MG tablet TAKE 2 TABLETS BY MOUTH EVERY DAY   trihexyphenidyl (ARTANE) 2 MG tablet Take 1 tablet by mouth 2 (two) times daily.   No facility-administered  encounter medications on file as of 05/07/2021.     Thank you for the opportunity to participate in the care of Ms. Basinski.  The palliative care team will continue to follow. Please call our office at [phone] if we can be of additional assistance.   Note: Portions of this note were generated with Lobbyist. Dictation errors may occur despite best attempts at proofreading.  Teodoro Spray, NP

## 2021-05-11 ENCOUNTER — Ambulatory Visit: Payer: BC Managed Care – PPO | Admitting: Sports Medicine

## 2021-05-15 ENCOUNTER — Telehealth: Payer: Self-pay

## 2021-05-15 DIAGNOSIS — M5030 Other cervical disc degeneration, unspecified cervical region: Secondary | ICD-10-CM | POA: Diagnosis not present

## 2021-05-15 DIAGNOSIS — G232 Striatonigral degeneration: Secondary | ICD-10-CM | POA: Diagnosis not present

## 2021-05-15 DIAGNOSIS — M5136 Other intervertebral disc degeneration, lumbar region: Secondary | ICD-10-CM | POA: Diagnosis not present

## 2021-05-15 DIAGNOSIS — M4692 Unspecified inflammatory spondylopathy, cervical region: Secondary | ICD-10-CM | POA: Diagnosis not present

## 2021-05-15 NOTE — Telephone Encounter (Signed)
Elaine Curry called to let you know that [name] seen her pain management doctor today and wants to know if you can change her LORazepam (ATIVAN) 1 MG tablet To 1/2 mg for now on to give her time to adjust to the other stuff.

## 2021-05-16 NOTE — Telephone Encounter (Signed)
Spoke with [name], he stated that the pill was too small to break accurately in 1/2. He requested a new prescription be sent to [facility].

## 2021-05-17 ENCOUNTER — Ambulatory Visit (INDEPENDENT_AMBULATORY_CARE_PROVIDER_SITE_OTHER): Payer: Medicare Other | Admitting: Sports Medicine

## 2021-05-17 ENCOUNTER — Other Ambulatory Visit: Payer: Self-pay

## 2021-05-17 DIAGNOSIS — L989 Disorder of the skin and subcutaneous tissue, unspecified: Secondary | ICD-10-CM

## 2021-05-17 MED ORDER — LORAZEPAM 0.5 MG PO TABS: 0.5000 mg | ORAL_TABLET | Freq: Two times a day (BID) | ORAL | 2 refills | Status: AC | PRN

## 2021-05-17 MED ORDER — DOXYCYCLINE HYCLATE 100 MG PO TABS
100.0000 mg | ORAL_TABLET | Freq: Two times a day (BID) | ORAL | 0 refills | Status: AC
Start: 2021-05-17 — End: ?

## 2021-05-17 MED ORDER — LORAZEPAM 0.5 MG PO TABS: 0.5000 mg | ORAL_TABLET | Freq: Two times a day (BID) | ORAL | 2 refills | Status: DC | PRN

## 2021-05-17 NOTE — Telephone Encounter (Signed)
New dose sent [facility] [address]. By [facility]. Please call and cancel that one. Resent prescription to [facility].   ___________________________________________ Clearnce Sorrel, DNP, APRN, FNP-BC Primary Care and Sports Medicine [place]

## 2021-05-17 NOTE — Progress Notes (Signed)
° ° °  Procedures performed today:    None.  Independent interpretation of notes and tests performed by another provider:   None.  Brief History, Exam, Impression, and Recommendations:    Skin lesion of back Elaine Curry returns, she is a pleasant 61 year old female, end-stage Parkinson's/multiple sclerosis, she has a history of basal cell carcinoma. Recently she was noted to have a weeping skin lesion on her mid back left side, it looks somewhat like a basal cell carcinoma, it does appear somewhat inflamed so we will do a course of doxycycline and wait 10 to 14 days, we will bring her back in a 30-minute slot for excisional biopsy with hyfrecation.  Chronic process with exacerbation and pharmacologic intervention  ___________________________________________ [name]. Dianah Field, M.D., ABFM., CAQSM. Primary Care and [place] Instructor of [place] [facility]

## 2021-05-17 NOTE — Assessment & Plan Note (Signed)
Elaine Curry returns, she is a pleasant 61 year old female, end-stage Parkinson's/multiple sclerosis, she has a history of basal cell carcinoma. Recently she was noted to have a weeping skin lesion on her mid back left side, it looks somewhat like a basal cell carcinoma, it does appear somewhat inflamed so we will do a course of doxycycline and wait 10 to 14 days, we will bring her back in a 30-minute slot for excisional biopsy with hyfrecation.

## 2021-05-17 NOTE — Telephone Encounter (Signed)
[facility] to ignore the prescription for this patient.

## 2021-05-28 ENCOUNTER — Other Ambulatory Visit: Payer: Medicare Other | Admitting: Hospice

## 2021-05-28 ENCOUNTER — Other Ambulatory Visit: Payer: Self-pay

## 2021-05-28 DIAGNOSIS — G232 Striatonigral degeneration: Secondary | ICD-10-CM

## 2021-05-28 DIAGNOSIS — Z515 Encounter for palliative care: Secondary | ICD-10-CM

## 2021-05-28 DIAGNOSIS — E43 Unspecified severe protein-calorie malnutrition: Secondary | ICD-10-CM

## 2021-05-28 DIAGNOSIS — G8929 Other chronic pain: Secondary | ICD-10-CM

## 2021-05-28 DIAGNOSIS — F411 Generalized anxiety disorder: Secondary | ICD-10-CM

## 2021-05-28 NOTE — Progress Notes (Signed)
[facility] Consult Note Telephone: [phone]  Fax: [phone]  PATIENT NAME: Elaine Curry [address] [place] [address] [phone] (home)  DOB: 1960-06-26 MRN: [id number]  PRIMARY CARE PROVIDER:    Samuel Bouche, NP,  [employer] [place] [address] [phone]  REFERRING PROVIDER:   Samuel Bouche, [place],  [place] [address] [phone]  RESPONSIBLE PARTY:   Self Elaine Curry - spouse is HCPOA Likes to be called Elaine Curry. Contact Information     Name Relation Home Work [address]   Barton Fanny Spouse   [phone]   Shelbie Ammons   [phone]        I met face to face with patient and family at home. Palliative Care was asked to follow this patient by consultation request of  Samuel Bouche, NP to address advance care planning, complex medical decision making and goals of care clarification. Elaine Curry is home with patient during visit. This is a follow up visit.    ASSESSMENT AND / RECOMMENDATIONS:   CODE STATUS: Patient is a Do Not Resuscitate.  Goals of Care: Goals include to maximize quality of life and symptom management.  Extensive discussion today on de-escalation of care and goals of care clarification due to patient's worsening disease progression.  Most selections today include DO NOT RESUSCITATE, comfort measures, IV fluids for defined trial.,  Antibiotics if indicated, no feeding tube. Patient leaning towards hospice care after basal cell mass is excised Friday this week.  Spiritual care is ongoing ;  patient was recently baptized .  Visit consisted of counseling and education dealing with the complex and emotionally intense issues of symptom management and palliative care in the setting of serious and potentially life-threatening illness. Palliative care team will continue to support patient, patient's family, and medical team.  Symptom  Management/Plan: Protein caloric malnutrition:  Patient is cachectic despite adequate oral intake. Current weight is 107 Ib down from 115 Ibs two months ago and 130 Ibs a year ago. Height is 5 feet 5 inches, BMI 17.8 Albumin is 3.5 down from 4.3 previously. Continue to Offer 4-6 small meals of choice daily. Ensure BID.  ST consult.  Multiple system atrophy, Parkinson variant: worsening , weight loss, significant decline in mobility; recently completed physical therapy with no improvement in gait/mobility.  Followed by neurologist at [facility], appointment is early next month. Continue Amantadine and Sinemet as ordered.   Constipation: Managed with Colace and Miralax.  Depression/anxiety: Zoloft. Counseling services from [employer] work is ongoing.  Continue lorazepam as ordered prn.  Low back pain: Managed with Oxycodone; effective. [facility] manages her pain. Next appointment 06/12/21.  Insomnia: Managed with Melatonin 5 mg at bedtime.   Follow up: Palliative care will continue to follow for complex medical decision making, advance care planning, and clarification of goals. Return 6 weeks or prn.Encouraged to call provider sooner with any concerns.   Family /Caregiver/Community Supports: Patient lives at home with spouse, siblings live close by and come around to help with care and errands. Strong family support system identified  HOSPICE ELIGIBILITY/DIAGNOSIS: TBD  Chief Complaint: Follow up visit  HISTORY OF PRESENT ILLNESS:  Elaine Curry is a 61 y.o. year old female  with multiple morbidities requiring close monitoring and with high risk of complications and  mortality: Multiple system atrophy, Parkinson variant, started in 2019, worsening now can only take a few steps with max assist; max assist  in ADLs.  History of Depression, GAD, spondylosis of the lumber region, low back pain, constipation. Recent diagnosis of possible basal call carcinoma in upper left back; plan is  for excisional biopsy and hyfrecation this Friday. She denies pain/discomfort during visit, endorses worsening muscular weakness. History obtained from review of EMR, discussion with primary team, caregiver, family and/or Elaine Curry.  Review and summarization of Epic records shows history from other than patient. Rest of 10 point ROS asked and negative.    PAST MEDICAL HISTORY:  Active Ambulatory Problems    Diagnosis Date Noted   Depression 10/09/2016   Generalized anxiety disorder 10/09/2017   History of basal cell cancer 12/21/2007   Menopausal syndrome 10/05/2019   Insomnia 10/05/2019   Allergy 10/05/2019   Syncope 12/09/2019   Cervical radiculopathy 01/25/2020   Paroxysmal SVT (supraventricular tachycardia) ([place]) 01/25/2020   Stage 3b chronic kidney disease ([place]) 01/25/2020   Chalazion of right lower eyelid 05/03/2020   Impacted cerumen of right ear 05/03/2020   Other spondylosis with radiculopathy, lumbar region 05/03/2020   Visual hallucinations 05/03/2020   Dehydration 05/03/2020   Urinary incontinence 05/03/2020   Weight loss 05/03/2020   Anemia 05/03/2020   Plantar warts 08/04/2020   Clinical trial exam 12/04/2020   Skin lesion of back 05/17/2021   Resolved Ambulatory Problems    Diagnosis Date Noted   No Resolved Ambulatory Problems   Past Medical History:  Diagnosis Date   Anxiety    Parkinson's disease ([place])    Renal insufficiency    Skin cancer     SOCIAL HX:  Social History   Tobacco Use   Smoking status: Never   Smokeless tobacco: Never  Substance Use Topics   Alcohol use: Yes    Comment: Occasionally     FAMILY HX:  Family History  Problem Relation Age of Onset   Lung cancer Mother    Esophageal cancer Father       ALLERGIES: No Known Allergies    PERTINENT MEDICATIONS:  Outpatient Encounter Medications as of 05/28/2021  Medication Sig   amantadine (SYMMETREL) 100 MG capsule Take 100 mg by mouth 3 (three) times daily.   benzonatate  (TESSALON) 100 MG capsule Take 1 capsule (100 mg total) by mouth 2 (two) times daily as needed for cough.   buPROPion (WELLBUTRIN XL) 150 MG 24 hr tablet TAKE 1 TABLET BY MOUTH EVERY DAY   carbidopa-levodopa (SINEMET IR) 25-100 MG tablet Take 1 tablet by mouth 5 (five) times daily.    cephALEXin (KEFLEX) 500 MG capsule Take 1 capsule (500 mg total) by mouth 2 (two) times daily.   ciclopirox (PENLAC) 8 % solution APPLY TOPICALLY AT BEDTIME. APPLY OVER NAIL AND SURROUNDING SKIN. APPLY DAILY OVER PREVIOUS COAT. AFTER SEVEN (7) DAYS, MAY REMOVE WITH ALCOHOL AND CONTINUE CYCLE.   docusate sodium (COLACE) 100 MG capsule Take 1 capsule (100 mg total) by mouth 3 (three) times daily as needed.   doxycycline (VIBRA-TABS) 100 MG tablet Take 1 tablet (100 mg total) by mouth 2 (two) times daily.   gabapentin (NEURONTIN) 300 MG capsule Take 2 capsules (600 mg total) by mouth 2 (two) times daily.   guaiFENesin (MUCINEX) 600 MG 12 hr tablet Take 1 tablet (600 mg total) by mouth 2 (two) times daily.   HYDROmorphone (DILAUDID) 2 MG tablet Take 2 mg by mouth 4 (four) times daily.   LORazepam (ATIVAN) 0.5 MG tablet Take 1 tablet (0.5 mg total) by mouth 2 (two) times daily as needed for anxiety.  NOTE DOSE CHANGE   morphine (MS CONTIN) 15 MG 12 hr tablet Take 1 tablet (15 mg total) by mouth every 12 (twelve) hours.   naproxen (NAPROSYN) 250 MG tablet TAKE 1 TABLET BY MOUTH THREE TIMES DAILY AS NEEDED   oxyCODONE-acetaminophen (PERCOCET) 10-325 MG tablet Take 1 tablet by mouth in the morning, at noon, and at bedtime.   QUEtiapine (SEROQUEL) 25 MG tablet Take 25 mg by mouth at bedtime.   sertraline (ZOLOFT) 50 MG tablet TAKE 2 TABLETS BY MOUTH EVERY DAY   trihexyphenidyl (ARTANE) 2 MG tablet Take 1 tablet by mouth 2 (two) times daily.   No facility-administered encounter medications on file as of 05/28/2021.   I spent 60 minutes providing this consultation; time includes spent with patient/family, chart review and  documentation. More than 50% of the time in this consultation was spent on care coordination   Thank you for the opportunity to participate in the care of Ms. Balboni.  The palliative care team will continue to follow. Please call our office at [phone] if we can be of additional assistance.   Note: Portions of this note were generated with Lobbyist. Dictation errors may occur despite best attempts at proofreading.  Teodoro Spray, NP

## 2021-05-31 ENCOUNTER — Telehealth: Payer: Self-pay

## 2021-05-31 NOTE — Telephone Encounter (Signed)
Elaine Curry called stating that he had to cancel the appt with Dr. Darene Lamer due to patient having nausea and diarrhea. Hospice may not approve patient if she has any surgery upcoming and they are supposed to be coming Monday. In Dr. Mcneil Sober absence, spoke with patient's PCP, Samuel Bouche. She advised that the family should call hospice staff and reiterate that this is an office procedure and for comfort care. Elaine Curry is aware of Elaine Curry's recommendation and will try to reach the Hospice staff tomorrow.  ?

## 2021-06-01 ENCOUNTER — Ambulatory Visit: Payer: Medicare Other | Admitting: Sports Medicine

## 2021-06-04 ENCOUNTER — Other Ambulatory Visit: Payer: Medicare Other | Admitting: Hospice

## 2021-06-04 ENCOUNTER — Other Ambulatory Visit: Payer: Self-pay

## 2021-06-04 ENCOUNTER — Ambulatory Visit: Payer: Medicare Other | Admitting: Sports Medicine

## 2021-06-04 DIAGNOSIS — Z515 Encounter for palliative care: Secondary | ICD-10-CM

## 2021-06-04 DIAGNOSIS — G232 Striatonigral degeneration: Secondary | ICD-10-CM

## 2021-06-04 DIAGNOSIS — E43 Unspecified severe protein-calorie malnutrition: Secondary | ICD-10-CM

## 2021-06-04 DIAGNOSIS — F411 Generalized anxiety disorder: Secondary | ICD-10-CM

## 2021-06-04 NOTE — Progress Notes (Signed)
[place] Consult Note Telephone: [phone]  Fax: [phone]  PATIENT NAME: Elaine Curry [address] [place] [address] [phone] (home)  DOB: 1961-03-03 MRN: [id number]  PRIMARY CARE PROVIDER:    Samuel Bouche, NP,  [place] [address] [phone]  REFERRING PROVIDER:   Samuel Bouche, [place],   [address] [phone]  RESPONSIBLE PARTY:   Self Elaine Curry - spouse is HCPOA Likes to be called Bil. Contact Information     Name Relation Home Work [address]   Barton Fanny Spouse   [phone]   Shelbie Ammons   [phone]        I met face to face with patient and family at home. Palliative Care was asked to follow this patient by consultation request of  Samuel Bouche, NP to address advance care planning, complex medical decision making and goals of care clarification. Elaine Curry is home with patient during visit. This is a follow up visit.    ASSESSMENT AND / RECOMMENDATIONS:   CODE STATUS: Patient is a Do Not Resuscitate.  Goals of Care: Goals include to maximize quality of life and symptom management.  Extensive discussion today on de-escalation of care and goals of care clarification due to patient's worsening disease progression.  Family desires hospice service; does not want to follow up with excision and biopsy of a possible basal cell mas at right upper back.  Most selections include DO NOT RESUSCITATE, comfort measures, IV fluids for defined trial.,  Antibiotics if indicated, no feeding tube. Spiritual care is ongoing;  patient was recently baptized. Collaborative discussions/coordination with [facility] and with patient's PCP.  Both agreed on patient's terminal prognosis.  Hospice referral initiated.  Visit consisted of counseling and education dealing with the complex and emotionally intense issues of  symptom management and palliative care in the setting of serious and potentially life-threatening illness. Palliative care team will continue to support patient, patient's family, and medical team.   Symptom Management/Plan: Protein caloric malnutrition:  Patient is cachectic  with progressive weight loss and weakness despite adequate oral intake. Current weight is 102 Ib down from 115 Ibs two months ago and 130 Ibs a year ago. Height is 5 feet 5 inches, BMI 16.97.  Albumin is 3.5 down from 4.3 previously. Continue to Offer 4-6 small meals of choice daily. Ensure BID.   Multiple system atrophy, Parkinson variant: worsening , weight loss, worsening dysarthria, significant decline in mobility; recently completed physical therapy with no improvement in gait/mobility.  PPS weak 40% down from 60% six months ago. Followed by neurologist at [facility], managed with  Amantadine and Sinemet as ordered.  Family is considering stopping Sinemet and amantadine.  Constipation: Managed with Colace and Miralax.  Depression/anxiety: Zoloft. Counseling services from [employer] work is ongoing.  Continue lorazepam as ordered prn.  Low back pain: Managed with Oxycodone; effective. [facility] manages her pain. Next appointment 06/12/21.  Insomnia: Managed with Melatonin 5 mg at bedtime.   Follow up: Palliative care will continue to follow for complex medical decision making, advance care planning, and clarification of goals. Return 6 weeks or prn.Encouraged to call provider sooner with any concerns.   Family /Caregiver/Community Supports: Patient lives at home with spouse, siblings live close by and come around to help with care and errands. Strong family support system identified  HOSPICE ELIGIBILITY/DIAGNOSIS: TBD  Chief Complaint: Follow up visit  HISTORY OF PRESENT ILLNESS:  Elaine Curry is a 61 y.o. year old female  with multiple morbidities requiring close monitoring and with high risk of  complications and  mortality: Multiple system atrophy, Parkinson variant, advanced/worsening; severe protein caloric malnutrition. History of Depression, GAD, spondylosis of the lumber region, low back pain, constipation. She denies pain/discomfort during visit, endorses worsening muscular weakness. History obtained from review of EMR, discussion with primary team, caregiver, family and/or Elaine Curry.  Review and summarization of Epic records shows history from other than patient. Rest of 10 point ROS asked and negative.    PAST MEDICAL HISTORY:  Active Ambulatory Problems    Diagnosis Date Noted   Depression 10/09/2016   Generalized anxiety disorder 10/09/2017   History of basal cell cancer 12/21/2007   Menopausal syndrome 10/05/2019   Insomnia 10/05/2019   Allergy 10/05/2019   Syncope 12/09/2019   Cervical radiculopathy 01/25/2020   Paroxysmal SVT (supraventricular tachycardia) ([place]) 01/25/2020   Stage 3b chronic kidney disease ([place]) 01/25/2020   Chalazion of right lower eyelid 05/03/2020   Impacted cerumen of right ear 05/03/2020   Other spondylosis with radiculopathy, lumbar region 05/03/2020   Visual hallucinations 05/03/2020   Dehydration 05/03/2020   Urinary incontinence 05/03/2020   Weight loss 05/03/2020   Anemia 05/03/2020   Plantar warts 08/04/2020   Clinical trial exam 12/04/2020   Skin lesion of back 05/17/2021   Resolved Ambulatory Problems    Diagnosis Date Noted   No Resolved Ambulatory Problems   Past Medical History:  Diagnosis Date   Anxiety    Parkinson's disease ([place])    Renal insufficiency    Skin cancer     SOCIAL HX:  Social History   Tobacco Use   Smoking status: Never   Smokeless tobacco: Never  Substance Use Topics   Alcohol use: Yes    Comment: Occasionally     FAMILY HX:  Family History  Problem Relation Age of Onset   Lung cancer Mother    Esophageal cancer Father       ALLERGIES: No Known Allergies    PERTINENT  MEDICATIONS:  Outpatient Encounter Medications as of 06/04/2021  Medication Sig   amantadine (SYMMETREL) 100 MG capsule Take 100 mg by mouth 3 (three) times daily.   benzonatate (TESSALON) 100 MG capsule Take 1 capsule (100 mg total) by mouth 2 (two) times daily as needed for cough.   buPROPion (WELLBUTRIN XL) 150 MG 24 hr tablet TAKE 1 TABLET BY MOUTH EVERY DAY   carbidopa-levodopa (SINEMET IR) 25-100 MG tablet Take 1 tablet by mouth 5 (five) times daily.    cephALEXin (KEFLEX) 500 MG capsule Take 1 capsule (500 mg total) by mouth 2 (two) times daily.   ciclopirox (PENLAC) 8 % solution APPLY TOPICALLY AT BEDTIME. APPLY OVER NAIL AND SURROUNDING SKIN. APPLY DAILY OVER PREVIOUS COAT. AFTER SEVEN (7) DAYS, MAY REMOVE WITH ALCOHOL AND CONTINUE CYCLE.   docusate sodium (COLACE) 100 MG capsule Take 1 capsule (100 mg total) by mouth 3 (three) times daily as needed.   doxycycline (VIBRA-TABS) 100 MG tablet Take 1 tablet (100 mg total) by mouth 2 (two) times daily.   gabapentin (NEURONTIN) 300 MG capsule Take 2 capsules (600 mg total) by mouth 2 (two) times daily.   guaiFENesin (MUCINEX) 600 MG 12 hr tablet Take 1 tablet (600 mg total) by mouth 2 (two) times daily.   HYDROmorphone (DILAUDID) 2 MG tablet Take 2 mg by mouth 4 (four) times daily.   LORazepam (ATIVAN) 0.5 MG  tablet Take 1 tablet (0.5 mg total) by mouth 2 (two) times daily as needed for anxiety. NOTE DOSE CHANGE   morphine (MS CONTIN) 15 MG 12 hr tablet Take 1 tablet (15 mg total) by mouth every 12 (twelve) hours.   naproxen (NAPROSYN) 250 MG tablet TAKE 1 TABLET BY MOUTH THREE TIMES DAILY AS NEEDED   oxyCODONE-acetaminophen (PERCOCET) 10-325 MG tablet Take 1 tablet by mouth in the morning, at noon, and at bedtime.   QUEtiapine (SEROQUEL) 25 MG tablet Take 25 mg by mouth at bedtime.   sertraline (ZOLOFT) 50 MG tablet TAKE 2 TABLETS BY MOUTH EVERY DAY   trihexyphenidyl (ARTANE) 2 MG tablet Take 1 tablet by mouth 2 (two) times daily.   No  facility-administered encounter medications on file as of 06/04/2021.     Thank you for the opportunity to participate in the care of Ms. Shillingford.  The palliative care team will continue to follow. Please call our office at [phone] if we can be of additional assistance.   Note: Portions of this note were generated with [employer]. Dictation errors may occur despite best attempts at proofreading.  Teodoro Spray, NP

## 2021-06-11 ENCOUNTER — Telehealth: Payer: Self-pay | Admitting: Medical-Surgical

## 2021-06-23 NOTE — Telephone Encounter (Signed)
Patty with [facility] called to inform Joy that this patient passed away 06-25-21 at 9:45 am ?

## 2021-06-23 DEATH — deceased

## 2022-07-05 IMAGING — DX DG HIP (WITH OR WITHOUT PELVIS) 2-3V*R*
3 series · 3 of 3 positions shown · non-contrast
Comparison: None.

CLINICAL DATA: Recent falls with right hip pain

EXAM:
DG HIP (WITH OR WITHOUT PELVIS) 2-3V RIGHT

[pelvis ap]
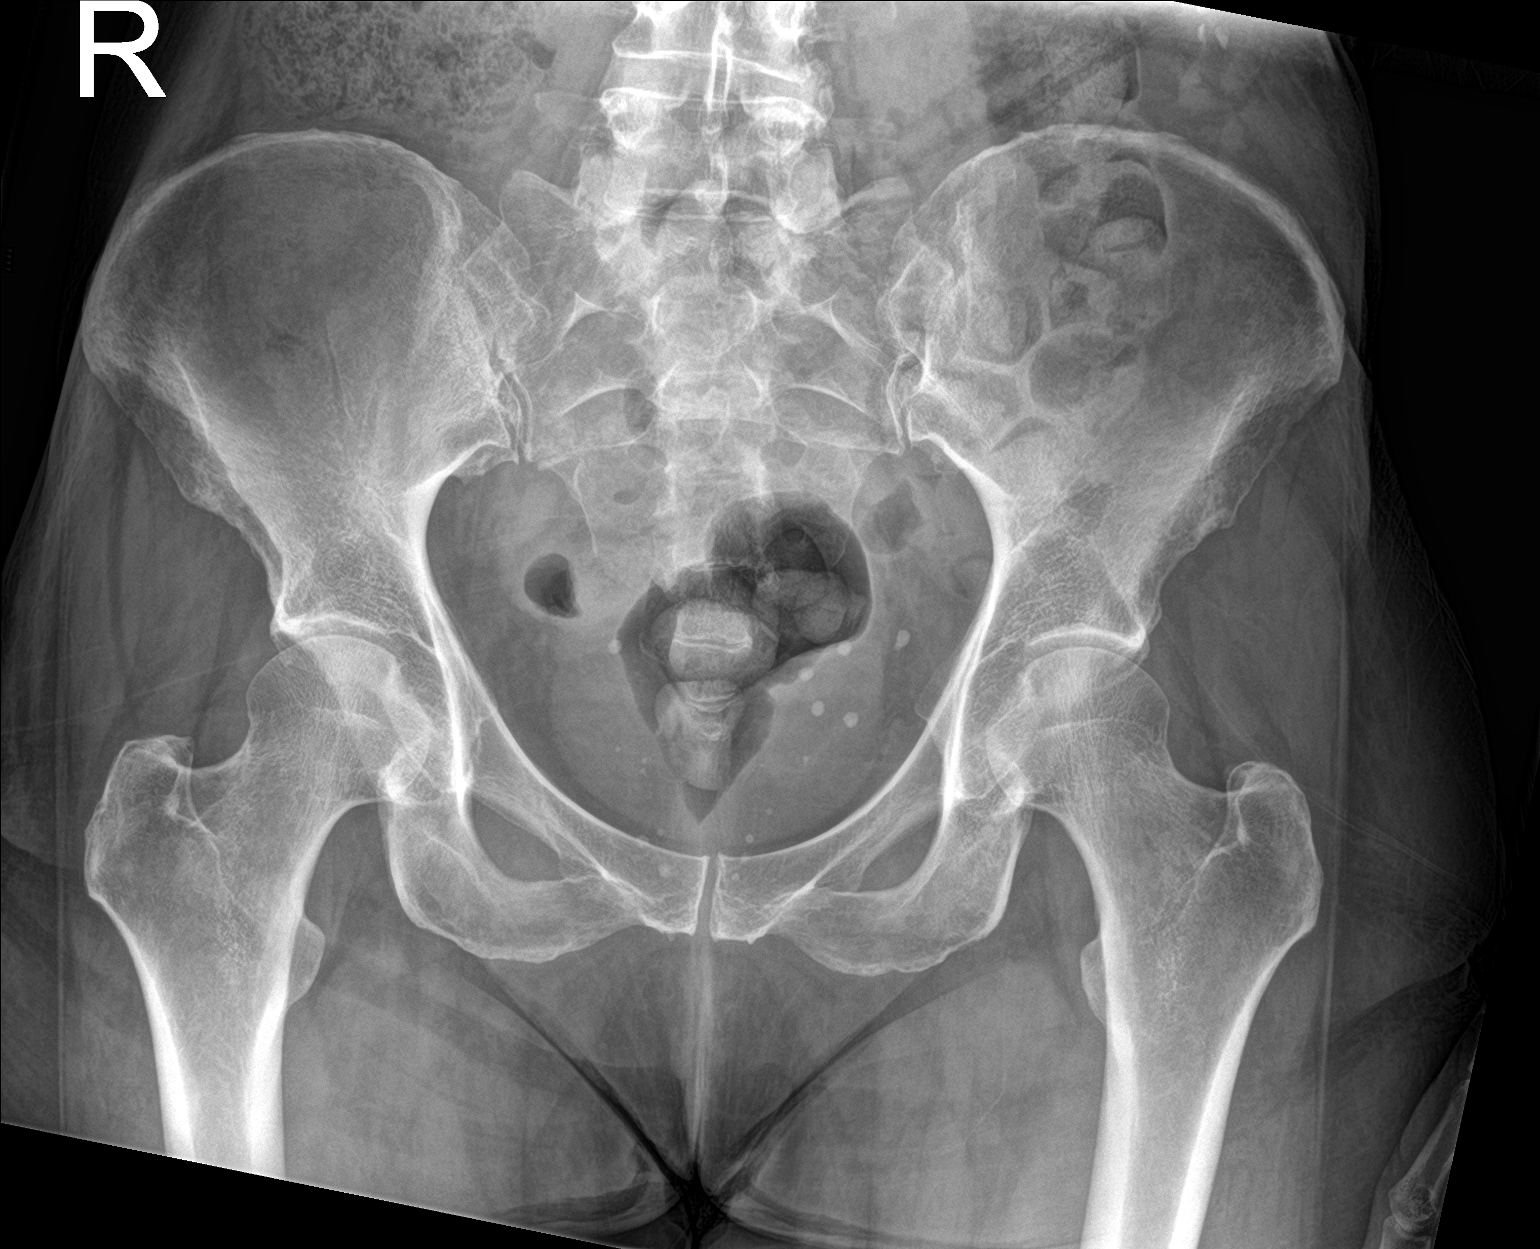

[hip ap]
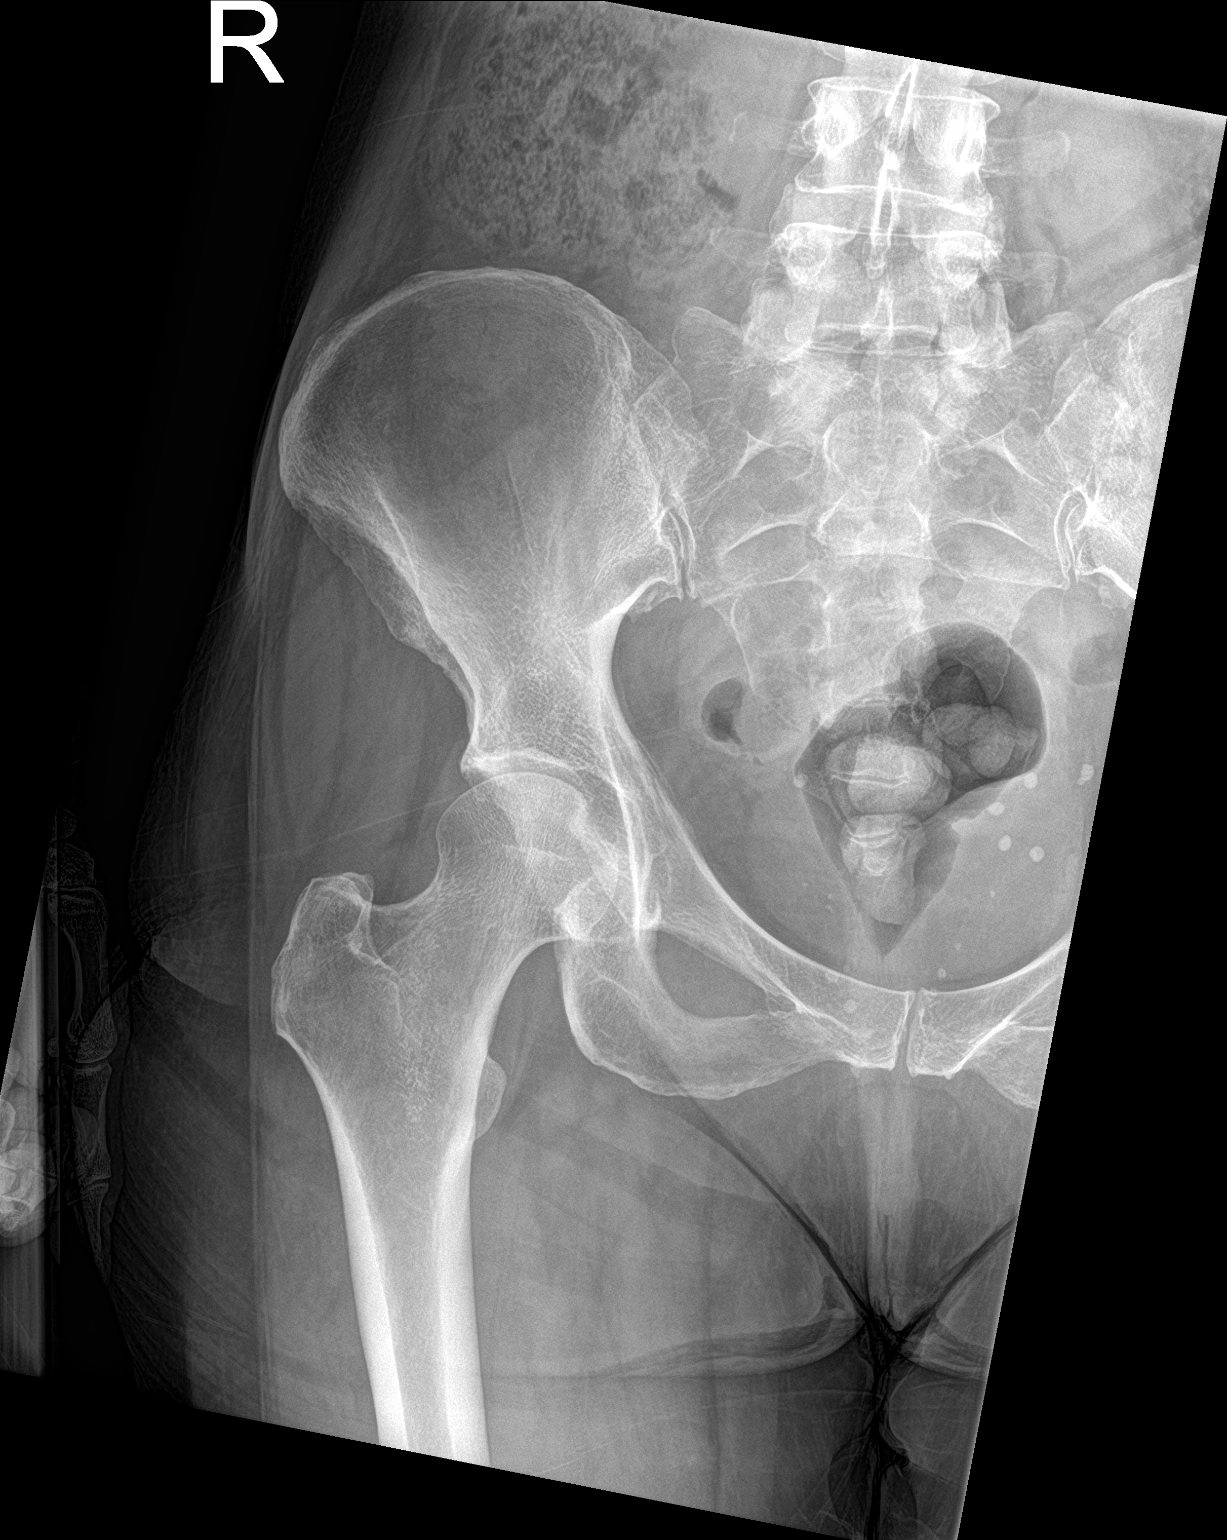

[hip lat]
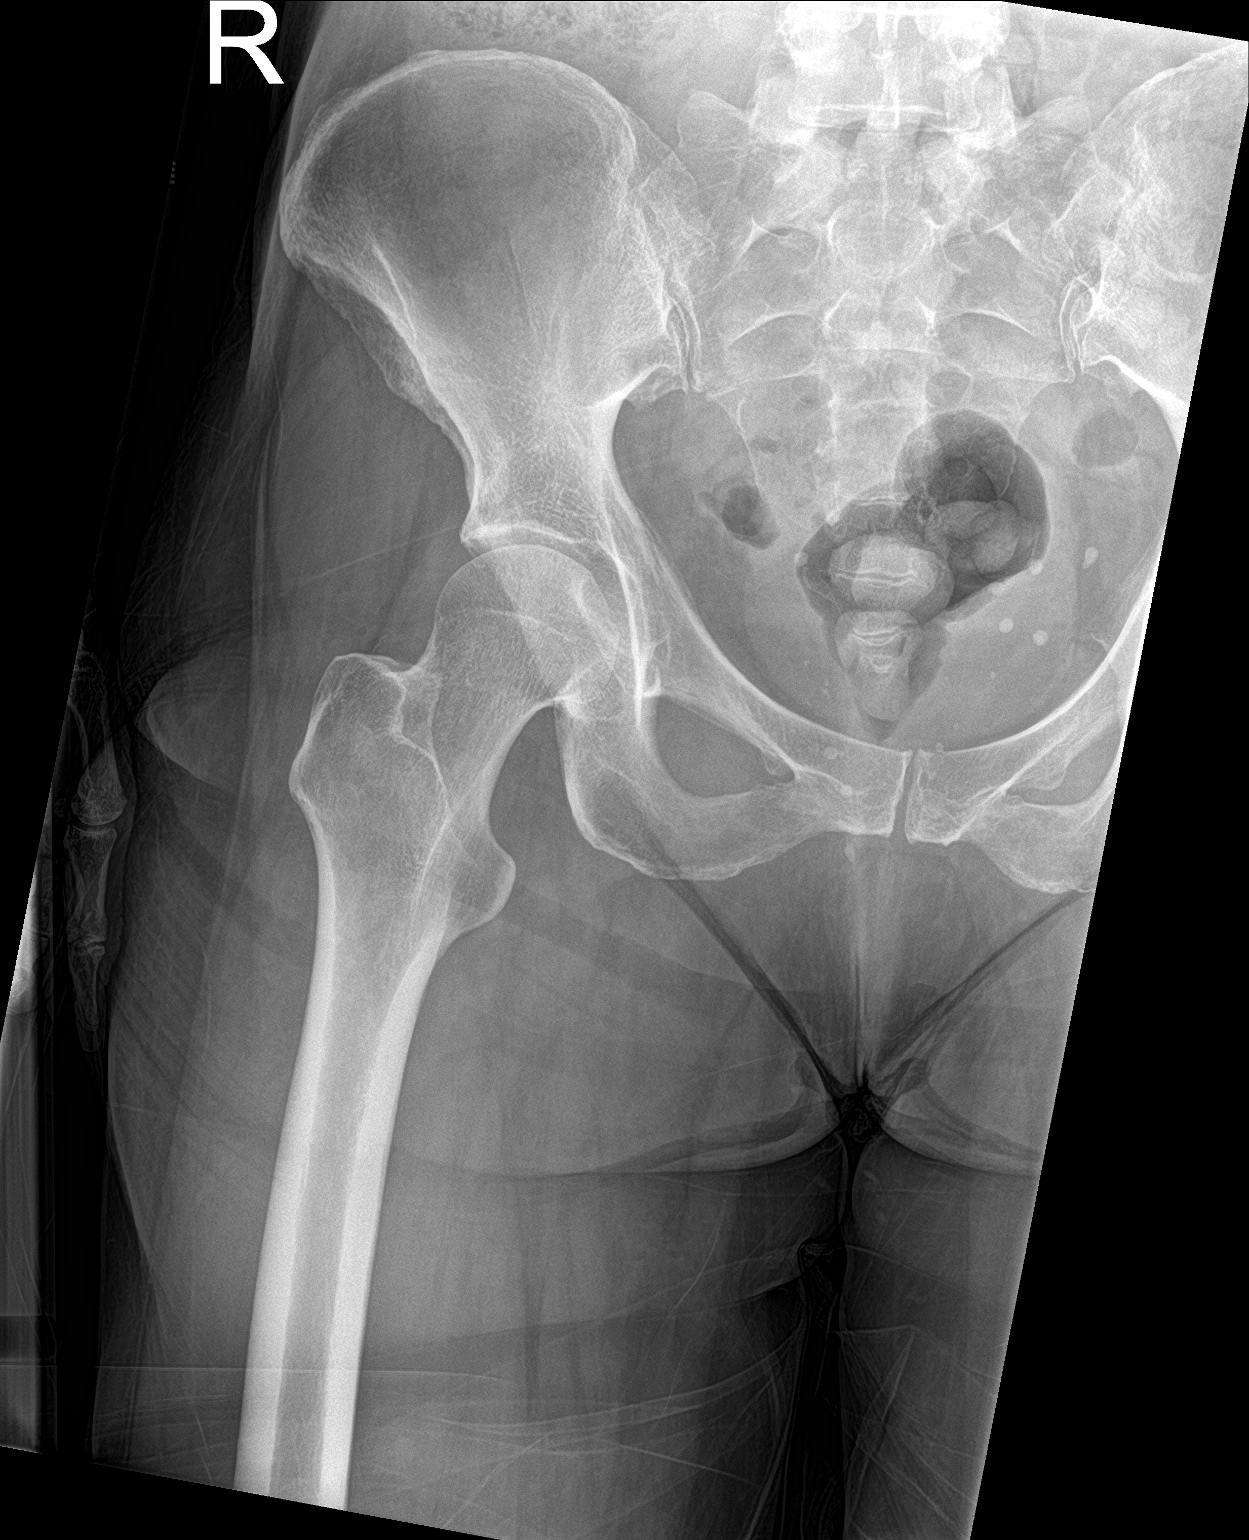

[3 of 3 positions shown; findings below may reference images not displayed]

FINDINGS: There is no evidence of hip fracture or dislocation. There is no
evidence of arthropathy or other focal bone abnormality.
IMPRESSION: Negative.
